# Patient Record
Sex: Male | Born: 1960
Health system: Southern US, Community
[De-identification: ages and names within clinical notes are randomized; demographics above are authoritative.]

## PROBLEM LIST (undated history)

## (undated) DIAGNOSIS — I10 Essential (primary) hypertension: Secondary | ICD-10-CM

---

## 2018-03-22 ENCOUNTER — Emergency Department (HOSPITAL_COMMUNITY): Payer: Self-pay

## 2018-03-22 ENCOUNTER — Inpatient Hospital Stay (HOSPITAL_COMMUNITY)
Admission: EM | Admit: 2018-03-22 | Discharge: 2018-03-25 | DRG: 641 | Disposition: A | Discharge status: Home / Self Care | Payer: Self-pay | Attending: Family Medicine | Admitting: Family Medicine

## 2018-03-22 ENCOUNTER — Encounter (HOSPITAL_COMMUNITY): Payer: Self-pay | Admitting: Internal Medicine

## 2018-03-22 DIAGNOSIS — D61818 Other pancytopenia: Secondary | ICD-10-CM | POA: Diagnosis present

## 2018-03-22 DIAGNOSIS — E871 Hypo-osmolality and hyponatremia: Principal | ICD-10-CM | POA: Diagnosis present

## 2018-03-22 DIAGNOSIS — F10220 Alcohol dependence with intoxication, uncomplicated: Secondary | ICD-10-CM | POA: Diagnosis present

## 2018-03-22 DIAGNOSIS — D696 Thrombocytopenia, unspecified: Secondary | ICD-10-CM | POA: Diagnosis present

## 2018-03-22 DIAGNOSIS — F10239 Alcohol dependence with withdrawal, unspecified: Secondary | ICD-10-CM | POA: Diagnosis not present

## 2018-03-22 DIAGNOSIS — K7010 Alcoholic hepatitis without ascites: Secondary | ICD-10-CM | POA: Diagnosis present

## 2018-03-22 DIAGNOSIS — IMO0001 Reserved for inherently not codable concepts without codable children: Secondary | ICD-10-CM | POA: Diagnosis present

## 2018-03-22 DIAGNOSIS — S0101XA Laceration without foreign body of scalp, initial encounter: Secondary | ICD-10-CM | POA: Diagnosis present

## 2018-03-22 DIAGNOSIS — F10920 Alcohol use, unspecified with intoxication, uncomplicated: Secondary | ICD-10-CM

## 2018-03-22 DIAGNOSIS — W109XXA Fall (on) (from) unspecified stairs and steps, initial encounter: Secondary | ICD-10-CM | POA: Diagnosis present

## 2018-03-22 DIAGNOSIS — Z87891 Personal history of nicotine dependence: Secondary | ICD-10-CM

## 2018-03-22 DIAGNOSIS — K760 Fatty (change of) liver, not elsewhere classified: Secondary | ICD-10-CM | POA: Diagnosis present

## 2018-03-22 DIAGNOSIS — F1020 Alcohol dependence, uncomplicated: Secondary | ICD-10-CM | POA: Diagnosis present

## 2018-03-22 DIAGNOSIS — Y908 Blood alcohol level of 240 mg/100 ml or more: Secondary | ICD-10-CM | POA: Diagnosis present

## 2018-03-22 DIAGNOSIS — E876 Hypokalemia: Secondary | ICD-10-CM | POA: Diagnosis present

## 2018-03-22 LAB — BPAM FFP
BLOOD PRODUCT EXPIRATION DATE: 201909112359
Blood Product Expiration Date: 201909112359
ISSUE DATE / TIME: 201909071525
ISSUE DATE / TIME: 201909071525
UNIT TYPE AND RH: 6200
UNIT TYPE AND RH: 6200

## 2018-03-22 LAB — SODIUM, URINE, RANDOM: Sodium, Ur: <10 mmol/L

## 2018-03-22 LAB — I-STAT CHEM 8, ED
BUN: 10 mg/dL (ref 6–20)
Calcium, Ion: 0.9 mmol/L — ABNORMAL LOW (ref 1.15–1.40)
Chloride: 85 mmol/L — ABNORMAL LOW (ref 98–111)
Creatinine, Ser: 1.3 mg/dL — ABNORMAL HIGH (ref 0.61–1.24)
Glucose, Bld: 125 mg/dL — ABNORMAL HIGH (ref 70–99)
HEMATOCRIT: 35 % — AB (ref 39.0–52.0)
Hemoglobin: 11.9 g/dL — ABNORMAL LOW (ref 13.0–17.0)
Potassium: 3.6 mmol/L (ref 3.5–5.1)
SODIUM: 120 mmol/L — AB (ref 135–145)
TCO2: 21 mmol/L — AB (ref 22–32)

## 2018-03-22 LAB — CHLORIDE, URINE, RANDOM

## 2018-03-22 LAB — CBC
HEMATOCRIT: 31.1 % — AB (ref 39.0–52.0)
HEMOGLOBIN: 11.2 g/dL — AB (ref 13.0–17.0)
MCH: 33.7 pg (ref 26.0–34.0)
MCHC: 36 g/dL (ref 30.0–36.0)
MCV: 93.7 fL (ref 78.0–100.0)
Platelets: 84 10*3/uL — ABNORMAL LOW (ref 150–400)
RBC: 3.32 MIL/uL — ABNORMAL LOW (ref 4.22–5.81)
RDW: 12.7 % (ref 11.5–15.5)
WBC: 3.8 10*3/uL — AB (ref 4.0–10.5)

## 2018-03-22 LAB — COMPREHENSIVE METABOLIC PANEL
ALBUMIN: 3.7 g/dL (ref 3.5–5.0)
ALT: 67 U/L — ABNORMAL HIGH (ref 0–44)
AST: 151 U/L — AB (ref 15–41)
Alkaline Phosphatase: 102 U/L (ref 38–126)
Anion gap: 15 (ref 5–15)
BUN: 9 mg/dL (ref 6–20)
CO2: 21 mmol/L — ABNORMAL LOW (ref 22–32)
Calcium: 7.9 mg/dL — ABNORMAL LOW (ref 8.9–10.3)
Chloride: 84 mmol/L — ABNORMAL LOW (ref 98–111)
Creatinine, Ser: 0.64 mg/dL (ref 0.61–1.24)
GFR calc Af Amer: >60 mL/min (ref >60)
GFR calc non Af Amer: 57 mL/min — ABNORMAL LOW (ref >60)
Glucose, Bld: 125 mg/dL — ABNORMAL HIGH (ref 70–99)
POTASSIUM: 3.5 mmol/L (ref 3.5–5.1)
SODIUM: 120 mmol/L — AB (ref 135–145)
Total Bilirubin: 0.7 mg/dL (ref 0.3–1.2)
Total Protein: 7.5 g/dL (ref 6.5–8.1)

## 2018-03-22 LAB — PREPARE FRESH FROZEN PLASMA
Unit division: 0
Unit division: 0

## 2018-03-22 LAB — URINALYSIS, ROUTINE W REFLEX MICROSCOPIC
BILIRUBIN URINE: NEGATIVE
Bacteria, UA: NONE SEEN
Glucose, UA: NEGATIVE mg/dL
Ketones, ur: NEGATIVE mg/dL
Leukocytes, UA: NEGATIVE
NITRITE: NEGATIVE
PROTEIN: NEGATIVE mg/dL
SPECIFIC GRAVITY, URINE: 1.016 (ref 1.005–1.030)
pH: 6 (ref 5.0–8.0)

## 2018-03-22 LAB — I-STAT CG4 LACTIC ACID, ED: LACTIC ACID, VENOUS: 2.43 mmol/L — AB (ref 0.5–1.9)

## 2018-03-22 LAB — PROTIME-INR
INR: 1.02
Prothrombin Time: 13.3 seconds (ref 11.4–15.2)

## 2018-03-22 LAB — OSMOLALITY, URINE: Osmolality, Ur: 249 mOsm/kg — ABNORMAL LOW (ref 300–900)

## 2018-03-22 LAB — CDS SEROLOGY

## 2018-03-22 LAB — ETHANOL: Alcohol, Ethyl (B): 434 mg/dL (ref <10)

## 2018-03-22 MED ORDER — LORAZEPAM 2 MG/ML IJ SOLN
1.0000 mg | Freq: Four times a day (QID) | INTRAMUSCULAR | Status: DC | PRN
  Administered 2018-03-23: 1 mg via INTRAVENOUS
  Filled 2018-03-22: qty 1

## 2018-03-22 MED ORDER — VITAMIN B-1 100 MG PO TABS
100.0000 mg | ORAL_TABLET | Freq: Every day | ORAL | Status: DC
  Administered 2018-03-22 – 2018-03-25 (×4): 100 mg via ORAL
  Filled 2018-03-22 (×5): qty 1

## 2018-03-22 MED ORDER — ACETAMINOPHEN 650 MG RE SUPP: 650.0000 mg | Freq: Four times a day (QID) | RECTAL | Status: DC | PRN

## 2018-03-22 MED ORDER — THIAMINE HCL 100 MG/ML IJ SOLN: 100.0000 mg | Freq: Every day | INTRAMUSCULAR | Status: DC

## 2018-03-22 MED ORDER — ADULT MULTIVITAMIN W/MINERALS CH
1.0000 | ORAL_TABLET | Freq: Every day | ORAL | Status: DC
  Administered 2018-03-22 – 2018-03-25 (×4): 1 via ORAL
  Filled 2018-03-22 (×4): qty 1

## 2018-03-22 MED ORDER — FOLIC ACID 1 MG PO TABS
1.0000 mg | ORAL_TABLET | Freq: Every day | ORAL | Status: DC
  Administered 2018-03-22 – 2018-03-25 (×4): 1 mg via ORAL
  Filled 2018-03-22 (×4): qty 1

## 2018-03-22 MED ORDER — VITAMIN B-1 100 MG PO TABS: 100.0000 mg | ORAL_TABLET | Freq: Every day | ORAL | Status: DC

## 2018-03-22 MED ORDER — ENOXAPARIN SODIUM 40 MG/0.4ML ~~LOC~~ SOLN
40.0000 mg | SUBCUTANEOUS | Status: DC
  Administered 2018-03-23 – 2018-03-25 (×3): 40 mg via SUBCUTANEOUS
  Filled 2018-03-22 (×3): qty 0.4

## 2018-03-22 MED ORDER — IOPAMIDOL (ISOVUE-300) INJECTION 61%
100.0000 mL | Freq: Once | INTRAVENOUS | Status: AC | PRN
  Administered 2018-03-22: 100 mL via INTRAVENOUS

## 2018-03-22 MED ORDER — SODIUM CHLORIDE 0.9 % IV SOLN
INTRAVENOUS | Status: DC
  Administered 2018-03-22: 19:00:00 via INTRAVENOUS

## 2018-03-22 MED ORDER — FOLIC ACID 1 MG PO TABS: 1.0000 mg | ORAL_TABLET | Freq: Every day | ORAL | Status: DC

## 2018-03-22 MED ORDER — LORAZEPAM 1 MG PO TABS
1.0000 mg | ORAL_TABLET | Freq: Four times a day (QID) | ORAL | Status: DC | PRN
  Administered 2018-03-22 – 2018-03-23 (×2): 1 mg via ORAL
  Filled 2018-03-22 (×2): qty 1

## 2018-03-22 MED ORDER — ONDANSETRON HCL 4 MG PO TABS: 4.0000 mg | ORAL_TABLET | Freq: Four times a day (QID) | ORAL | Status: DC | PRN

## 2018-03-22 MED ORDER — ONDANSETRON HCL 4 MG/2ML IJ SOLN
4.0000 mg | Freq: Four times a day (QID) | INTRAMUSCULAR | Status: DC | PRN
  Administered 2018-03-23: 4 mg via INTRAVENOUS
  Filled 2018-03-22: qty 2

## 2018-03-22 MED ORDER — POTASSIUM CHLORIDE CRYS ER 20 MEQ PO TBCR
40.0000 meq | EXTENDED_RELEASE_TABLET | Freq: Once | ORAL | Status: AC
  Administered 2018-03-22: 40 meq via ORAL
  Filled 2018-03-22: qty 2

## 2018-03-22 MED ORDER — ACETAMINOPHEN 325 MG PO TABS: 650.0000 mg | ORAL_TABLET | Freq: Four times a day (QID) | ORAL | Status: DC | PRN

## 2018-03-22 NOTE — ED Provider Notes (Signed)
[facility] EMERGENCY DEPARTMENT Provider Note   CSN: [id number] Arrival date & time: 03/22/18  1529     History   Chief Complaint No chief complaint on file.   HPI Stephen Chung is a 57 y.o. male.  HPI  The patient arrives by ambulance after he fell down the stairs, it is unclear how many stairs he fell down but was extremely intoxicated, had a large head laceration, was transported by paramedics initially hypotensive and altered.  At this time the patient states he only has a headache, denies any other complaints including chest pain shortness of breath arm or leg pain.  He had obvious trauma to his right upper chest with a mild bruise and a laceration of the back of his head which was treated with a dressing prehospital and cervical collar immobilization.  The patient is unable to give much more information as he is extremely intoxicated.  Level 5 caveat applies.  No past medical history on file.  There are no active problems to display for this patient.    The histories are not reviewed yet. Please review them in the "History" navigator section and refresh this SmartLink.    Denies PMH - denies medical problems, Denies medications Denies allergies to medicines Denies tob use Endorses ETO"H use    Home Medications    Prior to Admission medications   Not on File    Family History No family history on file.  Social History Social History   Tobacco Use  . Smoking status: Not on file  Substance Use Topics  . Alcohol use: Not on file  . Drug use: Not on file     Allergies   Patient has no allergy information on record.   Review of Systems Review of Systems  Unable to perform ROS: Mental status change     Physical Exam Updated Vital Signs There were no vitals taken for this visit.  Physical Exam  Constitutional: He appears well-developed and well-nourished. No distress.  HENT:  Head: Normocephalic.  Mouth/Throat: Oropharynx  is clear and moist. No oropharyngeal exudate.  Laceration to the posterior occiput, no other signs of injury  Eyes: Pupils are equal, round, and reactive to light. Conjunctivae and EOM are normal. Right eye exhibits no discharge. Left eye exhibits no discharge. No scleral icterus.  Neck: No JVD present. No thyromegaly present.  Immobilized in a cervical collar, denies tenderness on the posterior spine  Cardiovascular: Normal rate, regular rhythm, normal heart sounds and intact distal pulses. Exam reveals no gallop and no friction rub.  No murmur heard. Pulmonary/Chest: Effort normal and breath sounds normal. No respiratory distress. He has no wheezes. He has no rales. He exhibits tenderness ( Mild tenderness to the right upper chest wall, small bruise there, no crepitance or subcutaneous emphysema).  Abdominal: Soft. Bowel sounds are normal. He exhibits no distension and no mass. There is no tenderness.  Musculoskeletal: Normal range of motion. He exhibits no edema or tenderness.  All 4 extremities are supple, joints are supple, compartments are soft diffusely, moving everything without any deformity or difficulty  Lymphadenopathy:    He has no cervical adenopathy.  Neurological: He is alert. Coordination normal.  Follows commands, able to straight leg raise bilaterally with normal strength, grips normal, coordination normal, cranial nerves III through XII appear normal  Skin: Skin is warm and dry. No rash noted. No erythema.  Laceration to scalp, bruise to the right upper chest  Psychiatric: He has a normal  mood and affect. His behavior is normal.  Nursing note and vitals reviewed.    ED Treatments / Results  Labs (all labs ordered are listed, but only abnormal results are displayed) Labs Reviewed  CDS SEROLOGY  COMPREHENSIVE METABOLIC PANEL  CBC  ETHANOL  URINALYSIS, ROUTINE W REFLEX MICROSCOPIC  PROTIME-INR  I-STAT CHEM 8, ED  I-STAT CG4 LACTIC ACID, ED  PREPARE FRESH FROZEN  PLASMA  TYPE AND SCREEN  SAMPLE TO BLOOD BANK    EKG None  Radiology No results found.  Procedures .Marland KitchenLaceration Repair Date/Time: 03/22/2018 5:29 PM Performed by: Eber Hong, MD Authorized by: Eber Hong, MD   Consent:    Consent obtained:  Verbal   Consent given by:  Patient   Risks discussed:  Infection, pain, need for additional repair, poor cosmetic result and poor wound healing   Alternatives discussed:  No treatment and delayed treatment Anesthesia (see MAR for exact dosages):    Anesthesia method:  None Laceration details:    Location:  Scalp   Scalp location:  Occipital   Length (cm):  7   Depth (mm):  3 Repair type:    Repair type:  Simple Pre-procedure details:    Preparation:  Patient was prepped and draped in usual sterile fashion and imaging obtained to evaluate for foreign bodies Exploration:    Hemostasis achieved with:  Direct pressure   Wound exploration: wound explored through full range of motion and entire depth of wound probed and visualized     Wound extent: no fascia violation noted, no foreign bodies/material noted, no muscle damage noted, no nerve damage noted, no tendon damage noted, no underlying fracture noted and no vascular damage noted   Treatment:    Area cleansed with:  Betadine   Amount of cleaning:  Standard   Irrigation solution:  Sterile saline   Irrigation method:  Syringe Skin repair:    Repair method:  Sutures and staples   Suture technique:  Simple interrupted   Number of staples:  5 Approximation:    Approximation:  Close Post-procedure details:    Dressing:  Antibiotic ointment and sterile dressing   Patient tolerance of procedure:  Tolerated well, no immediate complications Comments:        .Critical Care Performed by: Eber Hong, MD Authorized by: Eber Hong, MD   Critical care provider statement:    Critical care time (minutes):  35   Critical care time was exclusive of:  Separately billable  procedures and treating other patients and teaching time   Critical care was necessary to treat or prevent imminent or life-threatening deterioration of the following conditions:  Trauma   Critical care was time spent personally by me on the following activities:  Blood draw for specimens, development of treatment plan with patient or surrogate, discussions with consultants, evaluation of patient's response to treatment, examination of patient, obtaining history from patient or surrogate, ordering and performing treatments and interventions, ordering and review of laboratory studies, ordering and review of radiographic studies, pulse oximetry, re-evaluation of patient's condition and review of old charts   (including critical care time)  Medications Ordered in ED Medications - No data to display   Initial Impression / Assessment and Plan / ED Course  I have reviewed the triage vital signs and the nursing notes.  Pertinent labs & imaging results that were available during my care of the patient were reviewed by me and considered in my medical decision making (see chart for details).  Clinical Course as of  Mar 22 1728  Sat Mar 22, 2018  1707 CT scans reviewed, large hematoma to the scalp otherwise no intracranial injuries, the patient's sodium is very low at 120, he will likely need to be admitted to the hospital because of the significant electrolyte abnormality which may have contributed to his fall however the alcohol level of 430 likely also contributed.  No other injuries on the CT scan of the chest abdomen and pelvis   [BM]    Clinical Course User Index [BM] Eber Hong, MD   The patient is suffered a head injury, will need CT scan of the head and cervical spine as well as the chest, it is unclear exactly what happened as the patient is unable to give me a very thorough history.  There is a significant leg which barrier.  Level 5 caveat applies, CT scans pending.  The patient will remain  immobilized in a cervical collar.  T scans revealed no signs of intracranial injury, there is a large hematoma, the laceration was repaired, I discussed the case with Dr. Doylene Canard who deferred to the medical service for admission for the hyponatremia.  I discussed the patient's care with the hospitalist to admit to the hospital.  The patient did require multiple repeat evaluations with close respiratory and neurologic monitoring secondary to severe intoxication, altered mental status and initially hypotension which gradually improved.  He has been given saline at a rate of 250 cc an hour to begin the resuscitation.  Final Clinical Impressions(s) / ED Diagnoses   Final diagnoses:  Laceration of occipital region of scalp, initial encounter  Alcoholic intoxication without complication (HCC)  Hyponatremia     Eber Hong, MD 03/22/18 1851

## 2018-03-22 NOTE — Progress Notes (Signed)
Chaplain responded to Trauma call. Chaplain connected with healthcare team. Team communicated no Pt. Needs at this time.
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
## 2018-03-22 NOTE — ED Triage Notes (Signed)
Patient arrived by Stephen Chung after witnessed falling down 15 steps this afternoon. Questionable loc. Family reported heavy ETOH today with large head laceration. Bleeding controlled with dressing intact -patient speaks spanish and complains of head pain, MAE X 4

## 2018-03-23 ENCOUNTER — Other Ambulatory Visit: Payer: Self-pay

## 2018-03-23 ENCOUNTER — Encounter (HOSPITAL_COMMUNITY): Payer: Self-pay | Admitting: General Practice

## 2018-03-23 LAB — BASIC METABOLIC PANEL
ANION GAP: 12 (ref 5–15)
ANION GAP: 11 (ref 5–15)
ANION GAP: 15 (ref 5–15)
Anion gap: 12 (ref 5–15)
BUN: 11 mg/dL (ref 6–20)
BUN: 6 mg/dL (ref 6–20)
BUN: 6 mg/dL (ref 6–20)
BUN: 7 mg/dL (ref 6–20)
CALCIUM: 9.1 mg/dL (ref 8.9–10.3)
CALCIUM: 8 mg/dL — AB (ref 8.9–10.3)
CALCIUM: 8.6 mg/dL — AB (ref 8.9–10.3)
CHLORIDE: 95 mmol/L — AB (ref 98–111)
CO2: 23 mmol/L (ref 22–32)
CO2: 23 mmol/L (ref 22–32)
CO2: 23 mmol/L (ref 22–32)
CO2: 19 mmol/L — AB (ref 22–32)
CREATININE: 0.49 mg/dL — AB (ref 0.61–1.24)
CREATININE: 0.54 mg/dL — AB (ref 0.61–1.24)
Calcium: 8.7 mg/dL — ABNORMAL LOW (ref 8.9–10.3)
Chloride: 92 mmol/L — ABNORMAL LOW (ref 98–111)
Chloride: 96 mmol/L — ABNORMAL LOW (ref 98–111)
Chloride: 98 mmol/L (ref 98–111)
Creatinine, Ser: 0.57 mg/dL — ABNORMAL LOW (ref 0.61–1.24)
Creatinine, Ser: 0.65 mg/dL (ref 0.61–1.24)
GFR calc Af Amer: >60 mL/min (ref >60)
GFR calc Af Amer: >60 mL/min (ref >60)
GFR calc Af Amer: >60 mL/min (ref >60)
GFR calc non Af Amer: >60 mL/min (ref >60)
GFR calc non Af Amer: >60 mL/min (ref >60)
GLUCOSE: 170 mg/dL — AB (ref 70–99)
GLUCOSE: 92 mg/dL (ref 70–99)
Glucose, Bld: 85 mg/dL (ref 70–99)
Glucose, Bld: 202 mg/dL — ABNORMAL HIGH (ref 70–99)
POTASSIUM: 4.2 mmol/L (ref 3.5–5.1)
POTASSIUM: 4.2 mmol/L (ref 3.5–5.1)
Potassium: 4 mmol/L (ref 3.5–5.1)
Potassium: 4.8 mmol/L (ref 3.5–5.1)
Sodium: 126 mmol/L — ABNORMAL LOW (ref 135–145)
Sodium: 130 mmol/L — ABNORMAL LOW (ref 135–145)
Sodium: 130 mmol/L — ABNORMAL LOW (ref 135–145)
Sodium: 133 mmol/L — ABNORMAL LOW (ref 135–145)

## 2018-03-23 LAB — TYPE AND SCREEN
ABO/RH(D): O POS
ANTIBODY SCREEN: NEGATIVE
UNIT DIVISION: 0
Unit division: 0

## 2018-03-23 LAB — CBC
HCT: 33.2 % — ABNORMAL LOW (ref 39.0–52.0)
Hemoglobin: 11.7 g/dL — ABNORMAL LOW (ref 13.0–17.0)
MCH: 33.7 pg (ref 26.0–34.0)
MCHC: 35.2 g/dL (ref 30.0–36.0)
MCV: 95.7 fL (ref 78.0–100.0)
PLATELETS: 84 10*3/uL — AB (ref 150–400)
RBC: 3.47 MIL/uL — AB (ref 4.22–5.81)
RDW: 13.2 % (ref 11.5–15.5)
WBC: 6.1 10*3/uL (ref 4.0–10.5)

## 2018-03-23 LAB — MAGNESIUM: MAGNESIUM: 2.1 mg/dL (ref 1.7–2.4)

## 2018-03-23 LAB — IRON AND TIBC
IRON: 120 ug/dL (ref 45–182)
Saturation Ratios: 29 % (ref 17.9–39.5)
TIBC: 414 ug/dL (ref 250–450)
UIBC: 294 ug/dL

## 2018-03-23 LAB — HIV ANTIBODY (ROUTINE TESTING W REFLEX): HIV Screen 4th Generation wRfx: NONREACTIVE

## 2018-03-23 LAB — BPAM RBC
Blood Product Expiration Date: 201909302359
Blood Product Expiration Date: 201910072359
ISSUE DATE / TIME: 201909071524
ISSUE DATE / TIME: 201909071524
Unit Type and Rh: 9500
Unit Type and Rh: 9500

## 2018-03-23 LAB — OSMOLALITY: Osmolality: 372 mOsm/kg (ref 275–295)

## 2018-03-23 LAB — ABO/RH: ABO/RH(D): O POS

## 2018-03-23 LAB — FERRITIN: Ferritin: 63 ng/mL (ref 24–336)

## 2018-03-23 MED ORDER — DEXTROSE 5 % IV BOLUS
500.0000 mL | Freq: Once | INTRAVENOUS | Status: AC
  Administered 2018-03-23: 500 mL via INTRAVENOUS

## 2018-03-23 MED ORDER — CHLORDIAZEPOXIDE HCL 5 MG PO CAPS
10.0000 mg | ORAL_CAPSULE | Freq: Three times a day (TID) | ORAL | Status: DC
  Administered 2018-03-23 – 2018-03-24 (×4): 10 mg via ORAL
  Filled 2018-03-23 (×4): qty 2

## 2018-03-23 MED ORDER — LORAZEPAM 1 MG PO TABS
1.0000 mg | ORAL_TABLET | Freq: Four times a day (QID) | ORAL | Status: DC | PRN
  Administered 2018-03-23: 1 mg via ORAL

## 2018-03-23 MED ORDER — LORAZEPAM 1 MG PO TABS
0.0000 mg | ORAL_TABLET | Freq: Four times a day (QID) | ORAL | Status: DC
  Administered 2018-03-23: 1 mg via ORAL
  Filled 2018-03-23 (×2): qty 1

## 2018-03-23 MED ORDER — SODIUM CHLORIDE 0.45 % IV SOLN
INTRAVENOUS | Status: DC
  Administered 2018-03-24: 01:00:00 via INTRAVENOUS

## 2018-03-23 MED ORDER — LORAZEPAM 2 MG/ML IJ SOLN: 1.0000 mg | Freq: Four times a day (QID) | INTRAMUSCULAR | Status: DC | PRN

## 2018-03-23 MED ORDER — LORAZEPAM 1 MG PO TABS: 0.0000 mg | ORAL_TABLET | Freq: Two times a day (BID) | ORAL | Status: DC

## 2018-03-23 MED ORDER — DEXTROSE 5 % IV SOLN
INTRAVENOUS | Status: DC
  Administered 2018-03-23 (×2): via INTRAVENOUS
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
## 2018-03-24 ENCOUNTER — Encounter (HOSPITAL_COMMUNITY): Payer: Self-pay

## 2018-03-24 LAB — CBC
HEMATOCRIT: 33.6 % — AB (ref 39.0–52.0)
HEMOGLOBIN: 11.7 g/dL — AB (ref 13.0–17.0)
MCH: 33.4 pg (ref 26.0–34.0)
MCHC: 34.8 g/dL (ref 30.0–36.0)
MCV: 96 fL (ref 78.0–100.0)
Platelets: 82 10*3/uL — ABNORMAL LOW (ref 150–400)
RBC: 3.5 MIL/uL — ABNORMAL LOW (ref 4.22–5.81)
RDW: 13 % (ref 11.5–15.5)
WBC: 5.1 10*3/uL (ref 4.0–10.5)

## 2018-03-24 LAB — BASIC METABOLIC PANEL
Anion gap: 10 (ref 5–15)
Anion gap: 11 (ref 5–15)
BUN: 9 mg/dL (ref 6–20)
BUN: 12 mg/dL (ref 6–20)
CHLORIDE: 96 mmol/L — AB (ref 98–111)
CHLORIDE: 97 mmol/L — AB (ref 98–111)
CO2: 25 mmol/L (ref 22–32)
CO2: 23 mmol/L (ref 22–32)
Calcium: 9.2 mg/dL (ref 8.9–10.3)
Calcium: 9 mg/dL (ref 8.9–10.3)
Creatinine, Ser: 0.57 mg/dL — ABNORMAL LOW (ref 0.61–1.24)
Creatinine, Ser: 0.78 mg/dL (ref 0.61–1.24)
GFR calc non Af Amer: >60 mL/min (ref >60)
GFR calc non Af Amer: >60 mL/min (ref >60)
Glucose, Bld: 103 mg/dL — ABNORMAL HIGH (ref 70–99)
Glucose, Bld: 126 mg/dL — ABNORMAL HIGH (ref 70–99)
POTASSIUM: 3.9 mmol/L (ref 3.5–5.1)
POTASSIUM: 3.9 mmol/L (ref 3.5–5.1)
Sodium: 131 mmol/L — ABNORMAL LOW (ref 135–145)
Sodium: 131 mmol/L — ABNORMAL LOW (ref 135–145)

## 2018-03-24 LAB — MAGNESIUM: Magnesium: 1.9 mg/dL (ref 1.7–2.4)

## 2018-03-24 MED ORDER — HYDRALAZINE HCL 20 MG/ML IJ SOLN
10.0000 mg | INTRAMUSCULAR | Status: DC | PRN
  Administered 2018-03-24: 10 mg via INTRAVENOUS
  Administered 2018-03-25: 0.5 mg via INTRAVENOUS
  Filled 2018-03-24 (×2): qty 1

## 2018-03-24 MED ORDER — CHLORDIAZEPOXIDE HCL 5 MG PO CAPS
5.0000 mg | ORAL_CAPSULE | Freq: Every day | ORAL | Status: DC
  Administered 2018-03-25: 5 mg via ORAL
  Filled 2018-03-24: qty 1

## 2018-03-24 MED ORDER — SODIUM CHLORIDE 0.9 % IV SOLN
INTRAVENOUS | Status: DC
  Administered 2018-03-24: 13:00:00 via INTRAVENOUS

## 2018-03-24 MED ORDER — HYDRALAZINE HCL 20 MG/ML IJ SOLN
10.0000 mg | Freq: Once | INTRAMUSCULAR | Status: AC
  Administered 2018-03-24: 10 mg via INTRAVENOUS
  Filled 2018-03-24: qty 1

## 2018-03-24 NOTE — Progress Notes (Signed)
Patients B/P is 194/88 and HR is 73. MD notified. Orders placed. Will continue to monitor.
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
## 2018-03-24 NOTE — Progress Notes (Signed)
[place] texted with bp of 191/105. No bp meds on profile. Otherwise,no complaints. CIWA of 4.

## 2018-03-25 DIAGNOSIS — K760 Fatty (change of) liver, not elsewhere classified: Secondary | ICD-10-CM

## 2018-03-25 DIAGNOSIS — E871 Hypo-osmolality and hyponatremia: Principal | ICD-10-CM

## 2018-03-25 DIAGNOSIS — D696 Thrombocytopenia, unspecified: Secondary | ICD-10-CM

## 2018-03-25 DIAGNOSIS — F1020 Alcohol dependence, uncomplicated: Secondary | ICD-10-CM

## 2018-03-25 LAB — BLOOD PRODUCT ORDER (VERBAL) VERIFICATION

## 2018-03-25 LAB — BASIC METABOLIC PANEL
Anion gap: 11 (ref 5–15)
BUN: 8 mg/dL (ref 6–20)
CHLORIDE: 99 mmol/L (ref 98–111)
CO2: 24 mmol/L (ref 22–32)
Calcium: 9.2 mg/dL (ref 8.9–10.3)
Creatinine, Ser: 0.56 mg/dL — ABNORMAL LOW (ref 0.61–1.24)
GFR calc Af Amer: >60 mL/min (ref >60)
GFR calc non Af Amer: >60 mL/min (ref >60)
GLUCOSE: 124 mg/dL — AB (ref 70–99)
POTASSIUM: 3.6 mmol/L (ref 3.5–5.1)
Sodium: 134 mmol/L — ABNORMAL LOW (ref 135–145)

## 2018-03-25 LAB — CBC
HEMATOCRIT: 33.5 % — AB (ref 39.0–52.0)
HEMOGLOBIN: 11.5 g/dL — AB (ref 13.0–17.0)
MCH: 33.8 pg (ref 26.0–34.0)
MCHC: 34.3 g/dL (ref 30.0–36.0)
MCV: 98.5 fL (ref 78.0–100.0)
Platelets: 108 10*3/uL — ABNORMAL LOW (ref 150–400)
RBC: 3.4 MIL/uL — ABNORMAL LOW (ref 4.22–5.81)
RDW: 13.4 % (ref 11.5–15.5)
WBC: 4.7 10*3/uL (ref 4.0–10.5)

## 2018-03-25 LAB — MAGNESIUM: Magnesium: 1.8 mg/dL (ref 1.7–2.4)

## 2018-03-25 NOTE — Social Work (Cosign Needed Addendum)
Nurse, CSW, and social work intern were present to assess the needs of Stephen Chung. [employer] video interpreting service was utilized and Alecia Lemming [phone]) was the live interpreter. CSW received referral for current substance abuse and providing resources in [place]. When asked about his alcohol use and if he deemed it a problem, Stephen Chung responded that his drinking had become a problem since his son died a month ago. He communicated that drinking had become his way of coping with the stress and sadness caused by the loss of his son. The patient reported that there are a couple of religious brothers from church that understand his situation and check on him. Stephen Chung communicated that he is going to work on his drinking problem with the help of these brothers, focusing on staying close to God, and working out to stay healthy. Nurse, CSW, and SW intern expressed understanding and empathetic responses.  The nurse, CSW, and SW intern then addressed how he will get home after discharging from the hospital. Stephen Chung did not know his address and did not know the directions to his home. He had never used the [place] bus system before. He did not have a ride from a friend and the only family that he has is a son in [place]. The patient was able to identify a local [place] grocery store near his home called "[employer]". CSW will be providing patient with a cab voucher to [employer] at [address], [place]. He is able to walk to his home from this location as it is on the same side of the road and not far away. CSW signing off as no other SW intervention services are needed.   Sydney Littrell Social Work Intern   03/25/18 (4:02 pm) - CSW reviewed CSW intern's note and discussed changes needed. Note has been reviewed again and is approved as written.  Genelle Bal, MSW, LCSW Licensed Clinical Social Worker Clinical Social Work Department [employer] [phone]
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
## 2018-03-25 NOTE — Progress Notes (Signed)
Stephen Chung to be D/C'd Home per MD order.  Discussed prescriptions and follow up appointments with the patient. Prescriptions given to patient, medication list explained in detail. Pt verbalized understanding.  Allergies as of 03/25/2018   No Known Allergies     Medication List    You have not been prescribed any medications.     Vitals:   03/25/18 0406 03/25/18 0841  BP: (!) 188/98 (!) 158/81  Pulse: 72 79  Resp: 16 18  Temp: 98.2 F (36.8 C) (!) 97.4 F (36.3 C)  SpO2: 96% 97%    Skin clean, dry and intact without evidence of skin break down, no evidence of skin tears noted. IV catheter discontinued intact. Site without signs and symptoms of complications. Dressing and pressure applied. Pt denies pain at this time. No complaints noted.  An After Visit Summary was printed and given to the patient. Patient escorted via WC, and D/C home via cab.  Selina Cooley BSN, RN

## 2018-10-19 ENCOUNTER — Other Ambulatory Visit: Payer: Self-pay

## 2018-10-19 ENCOUNTER — Encounter (HOSPITAL_COMMUNITY): Payer: Self-pay | Admitting: Emergency Medicine

## 2018-10-19 ENCOUNTER — Ambulatory Visit (HOSPITAL_COMMUNITY)
Admission: EM | Admit: 2018-10-19 | Discharge: 2018-10-19 | Disposition: A | Discharge status: Home / Self Care | Payer: Self-pay

## 2018-10-19 DIAGNOSIS — M79602 Pain in left arm: Secondary | ICD-10-CM

## 2018-10-19 HISTORY — DX: Essential (primary) hypertension: I10

## 2018-10-19 NOTE — ED Triage Notes (Signed)
Pt here for htn and sts hasnt taken meds in some time

## 2018-10-19 NOTE — ED Provider Notes (Signed)
[facility]    CSN: [id number] Arrival date & time: 10/19/18  1128     History   Chief Complaint Chief Complaint  Patient presents with  . Hypertension    HPI Stephen Chung is a 58 y.o. male.   This is a 58 year old man who presents with mild left biceps pain.  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  Patient is having no cough or fever.  He has had no recent trauma to the arm.  He is having no chest pain, shortness of breath, or headache.  No other joints are bothering him.  Patient says that he has a history of hypertension many years ago but he has not taken medication for this in 3 years.     Past Medical History:  Diagnosis Date  . Hypertension     Patient Active Problem List   Diagnosis Date Noted  . Hyponatremia 03/22/2018  . Alcoholism /alcohol abuse (HCC) 03/22/2018  . Thrombocytopenia (HCC) 03/22/2018  . Hepatic steatosis 03/22/2018    History reviewed. No pertinent surgical history.     Home Medications    Prior to Admission medications   Not on File    Family History History reviewed. No pertinent family history.  Social History Social History   Tobacco Use  . Smoking status: Former Smoker  Substance Use Topics  . Alcohol use: Yes    Alcohol/week: 1.0 standard drinks    Types: 1 Cans of beer per week    Comment: suspect heavier alcohol use  . Drug use: Never     Allergies   Patient has no known allergies.   Review of Systems Review of Systems  Musculoskeletal: Positive for myalgias.  All other systems reviewed and are negative.    Physical Exam Triage Vital Signs ED Triage Vitals  Enc Vitals Group     BP      Pulse      Resp      Temp      Temp src      SpO2      Weight      Height      Head Circumference      Peak Flow      Pain Score      Pain Loc      Pain Edu?      Excl. in GC?    No data found.  Updated Vital Signs BP 128/79  (BP Location: Right Arm)   Pulse 71   Temp 98.3 F (36.8 C) (Oral)   Resp 18   SpO2 96%    Physical Exam Vitals signs and nursing note reviewed.  Constitutional:      Appearance: Normal appearance. He is obese.  HENT:     Head: Normocephalic.     Right Ear: External ear normal.     Left Ear: External ear normal.     Nose: Nose normal.     Mouth/Throat:     Mouth: Mucous membranes are dry.  Eyes:     Conjunctiva/sclera: Conjunctivae normal.  Neck:     Musculoskeletal: Normal range of motion and neck supple.  Cardiovascular:     Rate and Rhythm: Normal rate and regular rhythm.     Heart sounds: Normal heart sounds.  Pulmonary:     Effort: Pulmonary effort is normal.     Breath sounds: Normal breath sounds.  Abdominal:     General: Bowel sounds are  normal.     Palpations: There is no mass.     Tenderness: There is no abdominal tenderness. There is no guarding.  Musculoskeletal: Normal range of motion.  Skin:    General: Skin is warm.  Neurological:     General: No focal deficit present.     Mental Status: He is alert.  Psychiatric:        Mood and Affect: Mood normal.      UC Treatments / Results  Labs (all labs ordered are listed, but only abnormal results are displayed) Labs Reviewed - No data to display  EKG None  Radiology No results found.  Procedures Procedures (including critical care time)  Medications Ordered in UC Medications - No data to display  Initial Impression / Assessment and Plan / UC Course  I have reviewed the triage vital signs and the nursing notes.  Pertinent labs & imaging results that were available during my care of the patient were reviewed by me and considered in my medical decision making (see chart for details).    Final Clinical Impressions(s) / UC Diagnoses   Final diagnoses:  Left arm pain   Discharge Instructions   None    ED Prescriptions    None     Controlled Substance Prescriptions [place] Controlled  Substance Registry consulted? Not Applicable   Elvina Sidle, MD 10/19/18 1249

## 2018-10-25 ENCOUNTER — Ambulatory Visit (HOSPITAL_COMMUNITY)
Admission: EM | Admit: 2018-10-25 | Discharge: 2018-10-25 | Disposition: A | Discharge status: Home / Self Care | Payer: Self-pay | Attending: Physician Assistant | Admitting: Physician Assistant

## 2018-10-25 ENCOUNTER — Other Ambulatory Visit: Payer: Self-pay

## 2018-10-25 DIAGNOSIS — M546 Pain in thoracic spine: Secondary | ICD-10-CM

## 2018-10-25 MED ORDER — MELOXICAM 7.5 MG PO TABS: 7.5000 mg | ORAL_TABLET | Freq: Every day | ORAL | 0 refills | Status: AC

## 2018-10-25 NOTE — ED Provider Notes (Signed)
[facility]    CSN: [id number] Arrival date & time: 10/25/18  1506     History   Chief Complaint Chief Complaint  Patient presents with  . Back Pain  . Headache    HPI Stephen Chung is a 58 y.o. male.   58 year old male comes in for back pain. HPI obtained by patient through video translator. Patient states had subjective fever last night, which resolved when waking up this morning. He noticed left sided thoracic back pain since that is intermittent, worse with movement. Denies injury/trauma. Denies cough, congestion, sore throat, rhinorrhea. Denies shortness of breath, wheezing. He also noticed mild headache that has resolved. He denies rashes, spreading erythema, warmth. Denies radiation of pain. Denies numbness/tingling. Has not tried anything for the symptoms.      Past Medical History:  Diagnosis Date  . Hypertension     Patient Active Problem List   Diagnosis Date Noted  . Hyponatremia 03/22/2018  . Alcoholism /alcohol abuse (HCC) 03/22/2018  . Thrombocytopenia (HCC) 03/22/2018  . Hepatic steatosis 03/22/2018    No past surgical history on file.     Home Medications    Prior to Admission medications   Medication Sig Start Date End Date Taking? Authorizing Provider  meloxicam (MOBIC) 7.5 MG tablet Take 1 tablet (7.5 mg total) by mouth daily. 10/25/18   Belinda FisherYu, Georgianne Gritz V, PA-C    Family History No family history on file.  Social History Social History   Tobacco Use  . Smoking status: Former Smoker  Substance Use Topics  . Alcohol use: Yes    Alcohol/week: 1.0 standard drinks    Types: 1 Cans of beer per week    Comment: suspect heavier alcohol use  . Drug use: Never     Allergies   Patient has no known allergies.   Review of Systems Review of Systems  Reason unable to perform ROS: See HPI as above.     Physical Exam Triage Vital Signs ED Triage Vitals  Enc Vitals Group     BP 10/25/18 1532 (!) 171/97     Pulse Rate  10/25/18 1532 91     Resp 10/25/18 1532 16     Temp 10/25/18 1532 98 F (36.7 C)     Temp Source 10/25/18 1532 Oral     SpO2 10/25/18 1532 97 %     Weight --      Height --      Head Circumference --      Peak Flow --      Pain Score 10/25/18 1533 6     Pain Loc --      Pain Edu? --      Excl. in GC? --    No data found.  Updated Vital Signs BP (!) 171/97 (BP Location: Right Arm)   Pulse 91   Temp 98 F (36.7 C) (Oral)   Resp 16   SpO2 97%   Physical Exam Constitutional:      General: He is not in acute distress.    Appearance: He is well-developed. He is not ill-appearing, toxic-appearing or diaphoretic.  HENT:     Head: Normocephalic and atraumatic.  Eyes:     Conjunctiva/sclera: Conjunctivae normal.     Pupils: Pupils are equal, round, and reactive to light.  Cardiovascular:     Rate and Rhythm: Normal rate and regular rhythm.     Heart sounds: Normal heart sounds. No murmur. No friction rub. No gallop.   Pulmonary:  Effort: Pulmonary effort is normal. No accessory muscle usage or respiratory distress.     Breath sounds: Normal breath sounds. No stridor. No decreased breath sounds, wheezing, rhonchi or rales.  Musculoskeletal:     Comments: No tenderness on palpation of the spinous processes. Tenderness to palpation of left thoracic back, with most tenderness to the paraspinal area. Full ROM of back, neck, shoulder. Strength normal and equal bilaterally. Sensation intact and equal bilaterally. Radial pulse 2+.  Skin:    General: Skin is warm and dry.  Neurological:     Mental Status: He is alert and oriented to person, place, and time.      UC Treatments / Results  Labs (all labs ordered are listed, but only abnormal results are displayed) Labs Reviewed - No data to display  EKG None  Radiology No results found.  Procedures Procedures (including critical care time)  Medications Ordered in UC Medications - No data to display  Initial Impression  / Assessment and Plan / UC Course  I have reviewed the triage vital signs and the nursing notes.  Pertinent labs & imaging results that were available during my care of the patient were reviewed by me and considered in my medical decision making (see chart for details).    Start mobic for pain. Warm compresses as directed. Continue to monitor symptoms. Return precautions given.   Final Clinical Impressions(s) / UC Diagnoses   Final diagnoses:  Acute left-sided thoracic back pain    ED Prescriptions    Medication Sig Dispense Auth. Provider   meloxicam (MOBIC) 7.5 MG tablet Take 1 tablet (7.5 mg total) by mouth daily. 15 tablet Threasa Alpha, [place] 10/25/18 1622

## 2018-10-25 NOTE — Discharge Instructions (Signed)
Start Mobic. Do not take ibuprofen (motrin/advil)/ naproxen (aleve) while on mobic. Warm compress to the back.

## 2018-10-25 NOTE — ED Triage Notes (Signed)
Per pt he has been having back pain since last night with a slight headache today. Pt has no fever, no sob no chest pain.

## 2020-04-11 ENCOUNTER — Encounter (HOSPITAL_COMMUNITY): Payer: Self-pay | Admitting: Emergency Medicine

## 2020-04-11 ENCOUNTER — Emergency Department (HOSPITAL_COMMUNITY)
Admission: EM | Admit: 2020-04-11 | Discharge: 2020-04-11 | Disposition: A | Discharge status: Home / Self Care | Payer: Self-pay | Attending: Emergency Medicine | Admitting: Emergency Medicine

## 2020-04-11 DIAGNOSIS — I10 Essential (primary) hypertension: Secondary | ICD-10-CM | POA: Insufficient documentation

## 2020-04-11 DIAGNOSIS — R040 Epistaxis: Secondary | ICD-10-CM | POA: Insufficient documentation

## 2020-04-11 DIAGNOSIS — Z5321 Procedure and treatment not carried out due to patient leaving prior to being seen by health care provider: Secondary | ICD-10-CM | POA: Insufficient documentation

## 2020-04-11 LAB — COMPREHENSIVE METABOLIC PANEL
ALT: 90 U/L — ABNORMAL HIGH (ref 0–44)
AST: 180 U/L — ABNORMAL HIGH (ref 15–41)
Albumin: 3.9 g/dL (ref 3.5–5.0)
Alkaline Phosphatase: 97 U/L (ref 38–126)
Anion gap: 10 (ref 5–15)
BUN: <5 mg/dL — ABNORMAL LOW (ref 6–20)
CO2: 25 mmol/L (ref 22–32)
Calcium: 8.6 mg/dL — ABNORMAL LOW (ref 8.9–10.3)
Chloride: 97 mmol/L — ABNORMAL LOW (ref 98–111)
Creatinine, Ser: 0.54 mg/dL — ABNORMAL LOW (ref 0.61–1.24)
GFR calc Af Amer: >60 mL/min (ref >60)
GFR calc non Af Amer: >60 mL/min (ref >60)
Glucose, Bld: 111 mg/dL — ABNORMAL HIGH (ref 70–99)
Potassium: 3.9 mmol/L (ref 3.5–5.1)
Sodium: 132 mmol/L — ABNORMAL LOW (ref 135–145)
Total Bilirubin: 0.5 mg/dL (ref 0.3–1.2)
Total Protein: 8.5 g/dL — ABNORMAL HIGH (ref 6.5–8.1)

## 2020-04-11 LAB — CBC
HCT: 34.2 % — ABNORMAL LOW (ref 39.0–52.0)
Hemoglobin: 11.5 g/dL — ABNORMAL LOW (ref 13.0–17.0)
MCH: 33 pg (ref 26.0–34.0)
MCHC: 33.6 g/dL (ref 30.0–36.0)
MCV: 98.3 fL (ref 80.0–100.0)
Platelets: 198 10*3/uL (ref 150–400)
RBC: 3.48 MIL/uL — ABNORMAL LOW (ref 4.22–5.81)
RDW: 13.2 % (ref 11.5–15.5)
WBC: 6.1 10*3/uL (ref 4.0–10.5)
nRBC: 0 % (ref 0.0–0.2)

## 2020-04-11 NOTE — ED Triage Notes (Signed)
Pt here from home with c/o nosebleed from his left nostril since Friday , b/p is elevated , no  Trauma noted

## 2020-04-11 NOTE — ED Notes (Signed)
Called x 2 no answer

## 2020-04-17 ENCOUNTER — Emergency Department (HOSPITAL_COMMUNITY)
Admission: EM | Admit: 2020-04-17 | Discharge: 2020-04-18 | Disposition: A | Discharge status: Home / Self Care | Payer: Self-pay | Attending: Emergency Medicine | Admitting: Emergency Medicine

## 2020-04-17 DIAGNOSIS — Z5321 Procedure and treatment not carried out due to patient leaving prior to being seen by health care provider: Secondary | ICD-10-CM | POA: Insufficient documentation

## 2020-04-17 DIAGNOSIS — R040 Epistaxis: Secondary | ICD-10-CM | POA: Insufficient documentation
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
## 2020-04-18 NOTE — ED Notes (Signed)
Patient called multiple times for vitals recheck with no resonse

## 2020-06-30 ENCOUNTER — Other Ambulatory Visit: Payer: Self-pay

## 2020-11-13 ENCOUNTER — Emergency Department (HOSPITAL_COMMUNITY): Payer: Self-pay

## 2020-11-13 ENCOUNTER — Other Ambulatory Visit: Payer: Self-pay

## 2020-11-13 ENCOUNTER — Inpatient Hospital Stay (HOSPITAL_COMMUNITY): Payer: Self-pay

## 2020-11-13 ENCOUNTER — Inpatient Hospital Stay (HOSPITAL_COMMUNITY)
Admission: EM | Admit: 2020-11-13 | Discharge: 2020-12-14 | DRG: 064 | Disposition: E | Payer: Self-pay | Attending: Neurology | Admitting: Neurology

## 2020-11-13 DIAGNOSIS — E873 Alkalosis: Secondary | ICD-10-CM | POA: Diagnosis present

## 2020-11-13 DIAGNOSIS — G935 Compression of brain: Secondary | ICD-10-CM | POA: Diagnosis present

## 2020-11-13 DIAGNOSIS — R680 Hypothermia, not associated with low environmental temperature: Secondary | ICD-10-CM | POA: Diagnosis present

## 2020-11-13 DIAGNOSIS — F1010 Alcohol abuse, uncomplicated: Secondary | ICD-10-CM | POA: Diagnosis present

## 2020-11-13 DIAGNOSIS — G8194 Hemiplegia, unspecified affecting left nondominant side: Secondary | ICD-10-CM | POA: Diagnosis present

## 2020-11-13 DIAGNOSIS — K760 Fatty (change of) liver, not elsewhere classified: Secondary | ICD-10-CM | POA: Diagnosis present

## 2020-11-13 DIAGNOSIS — Z515 Encounter for palliative care: Secondary | ICD-10-CM

## 2020-11-13 DIAGNOSIS — J13 Pneumonia due to Streptococcus pneumoniae: Secondary | ICD-10-CM | POA: Diagnosis not present

## 2020-11-13 DIAGNOSIS — I610 Nontraumatic intracerebral hemorrhage in hemisphere, subcortical: Secondary | ICD-10-CM

## 2020-11-13 DIAGNOSIS — Z66 Do not resuscitate: Secondary | ICD-10-CM | POA: Diagnosis not present

## 2020-11-13 DIAGNOSIS — E876 Hypokalemia: Secondary | ICD-10-CM | POA: Diagnosis not present

## 2020-11-13 DIAGNOSIS — D696 Thrombocytopenia, unspecified: Secondary | ICD-10-CM | POA: Diagnosis not present

## 2020-11-13 DIAGNOSIS — I10 Essential (primary) hypertension: Secondary | ICD-10-CM | POA: Diagnosis present

## 2020-11-13 DIAGNOSIS — Z20822 Contact with and (suspected) exposure to covid-19: Secondary | ICD-10-CM | POA: Diagnosis present

## 2020-11-13 DIAGNOSIS — Z9289 Personal history of other medical treatment: Secondary | ICD-10-CM

## 2020-11-13 DIAGNOSIS — G936 Cerebral edema: Secondary | ICD-10-CM | POA: Diagnosis present

## 2020-11-13 DIAGNOSIS — I615 Nontraumatic intracerebral hemorrhage, intraventricular: Principal | ICD-10-CM | POA: Diagnosis present

## 2020-11-13 DIAGNOSIS — R509 Fever, unspecified: Secondary | ICD-10-CM

## 2020-11-13 DIAGNOSIS — D62 Acute posthemorrhagic anemia: Secondary | ICD-10-CM | POA: Diagnosis not present

## 2020-11-13 DIAGNOSIS — E870 Hyperosmolality and hypernatremia: Secondary | ICD-10-CM | POA: Diagnosis not present

## 2020-11-13 DIAGNOSIS — R569 Unspecified convulsions: Secondary | ICD-10-CM

## 2020-11-13 DIAGNOSIS — R4701 Aphasia: Secondary | ICD-10-CM | POA: Diagnosis present

## 2020-11-13 DIAGNOSIS — G40901 Epilepsy, unspecified, not intractable, with status epilepticus: Secondary | ICD-10-CM | POA: Diagnosis present

## 2020-11-13 DIAGNOSIS — R29721 NIHSS score 21: Secondary | ICD-10-CM | POA: Diagnosis present

## 2020-11-13 DIAGNOSIS — N179 Acute kidney failure, unspecified: Secondary | ICD-10-CM | POA: Diagnosis present

## 2020-11-13 DIAGNOSIS — J9601 Acute respiratory failure with hypoxia: Secondary | ICD-10-CM | POA: Diagnosis present

## 2020-11-13 DIAGNOSIS — J9602 Acute respiratory failure with hypercapnia: Secondary | ICD-10-CM

## 2020-11-13 DIAGNOSIS — R414 Neurologic neglect syndrome: Secondary | ICD-10-CM | POA: Diagnosis present

## 2020-11-13 DIAGNOSIS — F1910 Other psychoactive substance abuse, uncomplicated: Secondary | ICD-10-CM | POA: Diagnosis present

## 2020-11-13 DIAGNOSIS — G9349 Other encephalopathy: Secondary | ICD-10-CM | POA: Diagnosis present

## 2020-11-13 DIAGNOSIS — I629 Nontraumatic intracranial hemorrhage, unspecified: Secondary | ICD-10-CM

## 2020-11-13 DIAGNOSIS — I619 Nontraumatic intracerebral hemorrhage, unspecified: Secondary | ICD-10-CM | POA: Diagnosis present

## 2020-11-13 DIAGNOSIS — Z0189 Encounter for other specified special examinations: Secondary | ICD-10-CM

## 2020-11-13 LAB — DIFFERENTIAL
Abs Immature Granulocytes: 0.06 10*3/uL (ref 0.00–0.07)
Basophils Absolute: 0.1 10*3/uL (ref 0.0–0.1)
Basophils Relative: 0 %
Eosinophils Absolute: 0 10*3/uL (ref 0.0–0.5)
Eosinophils Relative: 0 %
Immature Granulocytes: 0 %
Lymphocytes Relative: 1 %
Lymphs Abs: 0.1 10*3/uL — ABNORMAL LOW (ref 0.7–4.0)
Monocytes Absolute: 0.8 10*3/uL (ref 0.1–1.0)
Monocytes Relative: 6 %
Neutro Abs: 12.7 10*3/uL — ABNORMAL HIGH (ref 1.7–7.7)
Neutrophils Relative %: 93 %

## 2020-11-13 LAB — I-STAT ARTERIAL BLOOD GAS, ED
Acid-Base Excess: 9 mmol/L — ABNORMAL HIGH (ref 0.0–2.0)
Bicarbonate: 33.3 mmol/L — ABNORMAL HIGH (ref 20.0–28.0)
Calcium, Ion: 1.11 mmol/L — ABNORMAL LOW (ref 1.15–1.40)
HCT: 29 % — ABNORMAL LOW (ref 39.0–52.0)
Hemoglobin: 9.9 g/dL — ABNORMAL LOW (ref 13.0–17.0)
O2 Saturation: 100 %
Patient temperature: 96.3
Potassium: 3.9 mmol/L (ref 3.5–5.1)
Sodium: 133 mmol/L — ABNORMAL LOW (ref 135–145)
TCO2: 35 mmol/L — ABNORMAL HIGH (ref 22–32)
pCO2 arterial: 40.8 mmHg (ref 32.0–48.0)
pH, Arterial: 7.515 — ABNORMAL HIGH (ref 7.350–7.450)
pO2, Arterial: 562 mmHg — ABNORMAL HIGH (ref 83.0–108.0)

## 2020-11-13 LAB — I-STAT CHEM 8, ED
BUN: 11 mg/dL (ref 8–23)
Calcium, Ion: 1.06 mmol/L — ABNORMAL LOW (ref 1.15–1.40)
Chloride: 99 mmol/L (ref 98–111)
Creatinine, Ser: 1 mg/dL (ref 0.61–1.24)
Glucose, Bld: 225 mg/dL — ABNORMAL HIGH (ref 70–99)
HCT: 34 % — ABNORMAL LOW (ref 39.0–52.0)
Hemoglobin: 11.6 g/dL — ABNORMAL LOW (ref 13.0–17.0)
Potassium: 3.5 mmol/L (ref 3.5–5.1)
Sodium: 136 mmol/L (ref 135–145)
TCO2: 20 mmol/L — ABNORMAL LOW (ref 22–32)

## 2020-11-13 LAB — RESP PANEL BY RT-PCR (FLU A&B, COVID) ARPGX2
Influenza A by PCR: NEGATIVE
Influenza B by PCR: NEGATIVE
SARS Coronavirus 2 by RT PCR: NEGATIVE

## 2020-11-13 LAB — CBC
HCT: 31.6 % — ABNORMAL LOW (ref 39.0–52.0)
Hemoglobin: 9.4 g/dL — ABNORMAL LOW (ref 13.0–17.0)
MCH: 21 pg — ABNORMAL LOW (ref 26.0–34.0)
MCHC: 29.7 g/dL — ABNORMAL LOW (ref 30.0–36.0)
MCV: 70.5 fL — ABNORMAL LOW (ref 80.0–100.0)
Platelets: 161 10*3/uL (ref 150–400)
RBC: 4.48 MIL/uL (ref 4.22–5.81)
RDW: 21.1 % — ABNORMAL HIGH (ref 11.5–15.5)
WBC: 13.7 10*3/uL — ABNORMAL HIGH (ref 4.0–10.5)
nRBC: 0 % (ref 0.0–0.2)

## 2020-11-13 LAB — APTT: aPTT: 28 seconds (ref 24–36)

## 2020-11-13 LAB — RAPID URINE DRUG SCREEN, HOSP PERFORMED
Amphetamines: NOT DETECTED
Barbiturates: NOT DETECTED
Benzodiazepines: POSITIVE — AB
Cocaine: NOT DETECTED
Opiates: NOT DETECTED
Tetrahydrocannabinol: NOT DETECTED

## 2020-11-13 LAB — PROTIME-INR
INR: 1.1 (ref 0.8–1.2)
Prothrombin Time: 13.8 seconds (ref 11.4–15.2)

## 2020-11-13 LAB — SODIUM: Sodium: 137 mmol/L (ref 135–145)

## 2020-11-13 LAB — ETHANOL: Alcohol, Ethyl (B): <10 mg/dL (ref <10)

## 2020-11-13 LAB — MRSA PCR SCREENING: MRSA by PCR: NEGATIVE

## 2020-11-13 MED ORDER — CHLORHEXIDINE GLUCONATE 0.12% ORAL RINSE (MEDLINE KIT)
15.0000 mL | Freq: Two times a day (BID) | OROMUCOSAL | Status: DC
  Administered 2020-11-13 – 2020-11-16 (×6): 15 mL via OROMUCOSAL

## 2020-11-13 MED ORDER — SODIUM CHLORIDE 0.9 % IV SOLN
3000.0000 mg | Freq: Once | INTRAVENOUS | Status: AC
  Administered 2020-11-13: 3000 mg via INTRAVENOUS

## 2020-11-13 MED ORDER — ORAL CARE MOUTH RINSE
15.0000 mL | OROMUCOSAL | Status: DC
  Administered 2020-11-13 – 2020-11-16 (×29): 15 mL via OROMUCOSAL

## 2020-11-13 MED ORDER — LEVETIRACETAM IN NACL 1500 MG/100ML IV SOLN
1500.0000 mg | Freq: Two times a day (BID) | INTRAVENOUS | Status: DC
  Administered 2020-11-13 – 2020-11-15 (×5): 1500 mg via INTRAVENOUS
  Filled 2020-11-13 (×5): qty 100

## 2020-11-13 MED ORDER — ACETAMINOPHEN 160 MG/5ML PO SOLN
650.0000 mg | ORAL | Status: DC | PRN
  Administered 2020-11-14 – 2020-11-16 (×7): 650 mg
  Filled 2020-11-13 (×8): qty 20.3

## 2020-11-13 MED ORDER — LABETALOL HCL 5 MG/ML IV SOLN
INTRAVENOUS | Status: AC
  Filled 2020-11-13: qty 4

## 2020-11-13 MED ORDER — CLEVIDIPINE BUTYRATE 0.5 MG/ML IV EMUL
0.0000 mg/h | INTRAVENOUS | Status: DC
  Administered 2020-11-13 (×2): 18 mg/h via INTRAVENOUS
  Administered 2020-11-13: 16 mg/h via INTRAVENOUS
  Administered 2020-11-13: 18 mg/h via INTRAVENOUS
  Administered 2020-11-14 (×2): 8 mg/h via INTRAVENOUS
  Administered 2020-11-14: 18 mg/h via INTRAVENOUS
  Administered 2020-11-14: 14 mg/h via INTRAVENOUS
  Administered 2020-11-14: 13 mg/h via INTRAVENOUS
  Administered 2020-11-14: 7 mg/h via INTRAVENOUS
  Administered 2020-11-15: 10 mg/h via INTRAVENOUS
  Administered 2020-11-15: 14 mg/h via INTRAVENOUS
  Administered 2020-11-15: 13 mg/h via INTRAVENOUS
  Administered 2020-11-15: 12 mg/h via INTRAVENOUS
  Administered 2020-11-15: 8 mg/h via INTRAVENOUS
  Administered 2020-11-15: 12 mg/h via INTRAVENOUS
  Administered 2020-11-16 (×2): 16 mg/h via INTRAVENOUS
  Administered 2020-11-16: 18 mg/h via INTRAVENOUS
  Filled 2020-11-13 (×17): qty 100

## 2020-11-13 MED ORDER — ACETAMINOPHEN 325 MG PO TABS: 650.0000 mg | ORAL_TABLET | ORAL | Status: DC | PRN

## 2020-11-13 MED ORDER — PROPOFOL 1000 MG/100ML IV EMUL
INTRAVENOUS | Status: AC
  Administered 2020-11-13: 20 ug/kg/min via INTRAVENOUS
  Filled 2020-11-13: qty 100

## 2020-11-13 MED ORDER — ETOMIDATE 2 MG/ML IV SOLN
INTRAVENOUS | Status: AC | PRN
  Administered 2020-11-13: 20 mg via INTRAVENOUS

## 2020-11-13 MED ORDER — LORAZEPAM 2 MG/ML IJ SOLN
INTRAMUSCULAR | Status: AC
  Administered 2020-11-13: 2 mg via INTRAVENOUS
  Filled 2020-11-13: qty 1

## 2020-11-13 MED ORDER — ACETAMINOPHEN 650 MG RE SUPP: 650.0000 mg | RECTAL | Status: DC | PRN

## 2020-11-13 MED ORDER — MANNITOL 25 % IV SOLN: 80.0000 g | Freq: Once | INTRAVENOUS | Status: DC

## 2020-11-13 MED ORDER — CHLORHEXIDINE GLUCONATE CLOTH 2 % EX PADS
6.0000 | MEDICATED_PAD | Freq: Every day | CUTANEOUS | Status: DC
  Administered 2020-11-13 – 2020-11-15 (×3): 6 via TOPICAL

## 2020-11-13 MED ORDER — SODIUM CHLORIDE 0.9 % IV SOLN
200.0000 mg | Freq: Once | INTRAVENOUS | Status: AC
  Administered 2020-11-13: 200 mg via INTRAVENOUS
  Filled 2020-11-13 (×2): qty 20

## 2020-11-13 MED ORDER — MIDAZOLAM 50MG/50ML (1MG/ML) PREMIX INFUSION
0.0000 mg/h | INTRAVENOUS | Status: DC
  Administered 2020-11-13: 5 mg/h via INTRAVENOUS
  Administered 2020-11-13: 6 mg/h via INTRAVENOUS
  Administered 2020-11-13 – 2020-11-14 (×2): 8 mg/h via INTRAVENOUS
  Filled 2020-11-13 (×4): qty 50

## 2020-11-13 MED ORDER — SODIUM CHLORIDE 0.9% FLUSH: 3.0000 mL | Freq: Once | INTRAVENOUS | Status: DC

## 2020-11-13 MED ORDER — IOHEXOL 350 MG/ML SOLN
60.0000 mL | Freq: Once | INTRAVENOUS | Status: AC | PRN
  Administered 2020-11-13: 60 mL via INTRAVENOUS

## 2020-11-13 MED ORDER — PANTOPRAZOLE SODIUM 40 MG IV SOLR
40.0000 mg | Freq: Every day | INTRAVENOUS | Status: DC
  Administered 2020-11-13 – 2020-11-15 (×3): 40 mg via INTRAVENOUS
  Filled 2020-11-13 (×3): qty 40

## 2020-11-13 MED ORDER — LORAZEPAM 2 MG/ML IJ SOLN
2.0000 mg | Freq: Once | INTRAMUSCULAR | Status: AC
  Administered 2020-11-13: 2 mg via INTRAVENOUS

## 2020-11-13 MED ORDER — STROKE: EARLY STAGES OF RECOVERY BOOK
Freq: Once | Status: AC
  Filled 2020-11-13: qty 1

## 2020-11-13 MED ORDER — SODIUM CHLORIDE 3 % IV SOLN
INTRAVENOUS | Status: DC
  Filled 2020-11-13 (×8): qty 500

## 2020-11-13 MED ORDER — LORAZEPAM 2 MG/ML IJ SOLN: 2.0000 mg | Freq: Once | INTRAMUSCULAR | Status: AC

## 2020-11-13 MED ORDER — PROPOFOL 1000 MG/100ML IV EMUL
INTRAVENOUS | Status: AC
  Filled 2020-11-13: qty 100

## 2020-11-13 MED ORDER — LABETALOL HCL 5 MG/ML IV SOLN
5.0000 mg | Freq: Once | INTRAVENOUS | Status: AC
  Administered 2020-11-13: 5 mg via INTRAVENOUS

## 2020-11-13 MED ORDER — SUCCINYLCHOLINE CHLORIDE 20 MG/ML IJ SOLN
INTRAMUSCULAR | Status: AC | PRN
  Administered 2020-11-13: 120 mg via INTRAVENOUS

## 2020-11-13 MED ORDER — LORAZEPAM 2 MG/ML IJ SOLN: 2.0000 mg | Freq: Once | INTRAMUSCULAR | Status: DC | PRN

## 2020-11-13 MED ORDER — MANNITOL 20 % IV SOLN
80.0000 g | Freq: Once | INTRAVENOUS | Status: AC
  Administered 2020-11-13: 80 g via INTRAVENOUS
  Filled 2020-11-13: qty 500

## 2020-11-13 MED ORDER — MIDAZOLAM BOLUS VIA INFUSION
1.0000 mg | INTRAVENOUS | Status: DC | PRN
Start: 2020-11-13 — End: 2020-11-16
  Filled 2020-11-13: qty 2

## 2020-11-13 MED ORDER — PROPOFOL 1000 MG/100ML IV EMUL
INTRAVENOUS | Status: AC
  Administered 2020-11-13: 50 ug/kg/min
  Filled 2020-11-13: qty 100

## 2020-11-13 MED ORDER — PROPOFOL 1000 MG/100ML IV EMUL
5.0000 ug/kg/min | INTRAVENOUS | Status: DC
  Administered 2020-11-14: 20 ug/kg/min via INTRAVENOUS
  Administered 2020-11-14: 30 ug/kg/min via INTRAVENOUS
  Administered 2020-11-14: 10 ug/kg/min via INTRAVENOUS
  Administered 2020-11-15 (×2): 30 ug/kg/min via INTRAVENOUS
  Administered 2020-11-15: 20 ug/kg/min via INTRAVENOUS
  Administered 2020-11-16: 30 ug/kg/min via INTRAVENOUS
  Filled 2020-11-13 (×8): qty 100

## 2020-11-13 MED ORDER — LORAZEPAM 2 MG/ML IJ SOLN
INTRAMUSCULAR | Status: AC
  Filled 2020-11-13: qty 1

## 2020-11-13 MED ORDER — CLEVIDIPINE BUTYRATE 0.5 MG/ML IV EMUL
INTRAVENOUS | Status: AC
  Administered 2020-11-13: 1 mg/h
  Filled 2020-11-13: qty 50

## 2020-11-13 NOTE — ED Triage Notes (Addendum)
Paged for Code Stroke    BIB EMS after family found patient mute with right gaze and left neglect.  Patient had vomitted upon arrival.  Unknown LKW but was told possibly 1600 on 4/30 and unable to answer any questions or follow commands.  Some movement to right arm with resistance.

## 2020-11-13 NOTE — Progress Notes (Signed)
Notified by RN that patient continues to have intermittent RUE rhythmic twitching. On-call EEG tech is on way to hook him up to EEG.  - Loaded with Vimpat 200mg  IV once. PR interval of 142.  [facility] Pager Number Erick Blinks

## 2020-11-13 NOTE — TOC Initial Note (Addendum)
Transition of Care [facility]) - Initial/Assessment Note    Patient Details  Name: Stephen Chung MRN: [id number] Date of Birth: 07/16/1873  Transition of Care [facility]) CM/SW Contact:    Lockie Pares, RN Phone Number: 05-Dec-2020, 1:44 PM  Clinical Narrative:                  [facility] came in from [facility] with stroke like symptoms was called by neuro to investigate, as patient family did not know his last name? The patient is in critical condition and has a high chance of herniation. Discussed with CSW, called EMS, they are speaking with supervisor, supervisor will call this CM back with details, We need to send PD to place of residence to get family for GOC discussion. ASAP, and to properly identify patient. Hilda Lias in neuro is contact [phone].   [phone] PD called back will be going to place of pick up to gather more information on identity and family. They will call this RNCM back with any information.  Expected Discharge Plan: Home/Self Care     Patient Goals and CMS Choice        Expected Discharge Plan and Services Expected Discharge Plan: Home/Self Care                                              Prior Living Arrangements/Services                       Activities of Daily Living      Permission Sought/Granted                  Emotional Assessment              Admission diagnosis:  ICH (intracerebral hemorrhage) (HCC) [I61.9] Patient Active Problem List   Diagnosis Date Noted  . ICH (intracerebral hemorrhage) (HCC) 12-05-20   PCP:  No primary care provider on file. Pharmacy:  No Pharmacies Listed    Social Determinants of Health (SDOH) Interventions    Readmission Risk Interventions No flowsheet data found.

## 2020-11-13 NOTE — ED Notes (Signed)
Neurosurgery PA at bedside.

## 2020-11-13 NOTE — Progress Notes (Signed)
Patient to 4N24 at 1559, responsive to pain with reflexive movements on arrival. Patient with one pair of black socks, and $1.10 in change (8 dimes, 4 nickels, 10 pennies).  Aris Lot, RN

## 2020-11-13 NOTE — Progress Notes (Signed)
Patient noted to have RUE muscular twitching in a rhythmic pattern, Dr. Selina Cooley notified and V/O to add Propofol gtt.   Aris Lot, RN

## 2020-11-13 NOTE — Progress Notes (Addendum)
Paged for code stroke patient LKW 1600 yesterday NIH 28  Right gaze, vomiting, mute and seized in CT and vomited  +CT hemorrhage right hemisphere  Assisted with med management of labetolol, mannitol, cleviprex, propfolol gtt, keppra bolus versed gtt and ativan pushes  and intubation

## 2020-11-13 NOTE — Consult Note (Signed)
NAMEShaune Chung, MRN:  [id number], DOB:  07/16/1873, LOS: 0 ADMISSION DATE:  12/11/2020, CONSULTATION DATE:  11/25/2020 REFERRING MD:  Dr. Selina Cooley, CHIEF COMPLAINT:  Ventilator management    History of Present Illness:  Stephen Chung is a 60 y.o. male with unknown PMH who presented to the emergency department after being found with left-sided weakness and aphasia. On EMS arrival patient was able to move right UE spontaneously but unable to follow commands. Last known normal time was unknown.   On arrival vital signs significant for hypothermia with all vital signs stable. Labwork significant for glucose 214, creatine 1.27, anion gap 20, AST 81, wbc 13.7, hgb 9.4. Stroke head CT and CTA head and neck with acute intraparenchymal hemorrhage. During imaging patient vomited and began having seizure like activity therefore patient was intubated for airway protection and PCCM was consulted for vent management. NSG was consulted and did not feel as if evacuation was needed as ICH was likely not a survivable.   Pertinent  Medical History  Unknown   Significant Hospital Events: Including procedures, antibiotic start and stop dates in addition to other pertinent events   . 12/10/2020 admitted after being found  Interim History / Subjective:  As above   Objective   Blood pressure 120/66, pulse (!) 103, temperature (!) 101.8 F (38.8 C), temperature source Oral, resp. rate 20, height 5\' 4"  (1.626 m), weight 71.7 kg, SpO2 100 %.    Vent Mode: PRVC FiO2 (%):  [50 %-100 %] 50 % Set Rate:  [16 bmp] 16 bmp Vt Set:  [470 mL] 470 mL PEEP:  [5 cmH20] 5 cmH20 Plateau Pressure:  [13 cmH20-14 cmH20] 13 cmH20   Intake/Output Summary (Last 24 hours) at 11/27/2020 2152 Last data filed at 12/08/2020 1900 Gross per 24 hour  Intake 1490.41 ml  Output 450 ml  Net 1040.41 ml   Filed Weights   11/23/2020 1256 12/04/2020 1600  Weight: 73 kg 71.7 kg    Examination: General: Acute on chronic ill appearing middle  aged appearing male on mechanical ventilation, in NAD HEENT: ETT, MM pink/moist, PERRL,  Neuro: Slight response to painful stimuli, sedated on vent CV: s1s2 regular rate and rhythm, no murmur, rubs, or gallops,  PULM:  Clear to ascultation bilaterally, tolerating vent well, no added breath sounds GI: soft, bowel sounds active in all 4 quadrants, non-tender, non-distended, Extremities: warm/dry, no edema  Skin: no rashes or lesions  Labs/imaging that I havepersonally reviewed    Head CTA 11/23/2020 > No evidence of large or medium vessel occlusion, significant atherosclerotic vascular disease, aneurysm or vascular malformation. Large right intraparenchymal hemorrhage with intravascular penetration as shown by immediate previous head CT.  Resolved Hospital Problem list     Assessment & Plan:  Large acute basal ganglia  ICU with midline shift  -NSU evaluated and did not feel evacuation was indicated given likely nonsurvival bleed  P: Management per neurology  Maintain neuro protective measures; goal for eurothermia, euglycemia, eunatermia, normoxia, and PCO2 goal of 35-40 Nutrition and bowel regiment  Seizure precautions  AEDs per neurology  Aspirations precautions  EEG Family communication as able  B/P management per neuro  HTS per neuro  No acute need or indication for central line placement at this time   Acute Hypoxic Respiratory Failure  -In the setting of above  P: Continue ventilator support with lung protective strategies  Wean PEEP and FiO2 for sats greater than 90%. Head of bed elevated 30 degrees. Plateau pressures less than  30 cm H20.  Follow intermittent chest x-ray and ABG.   Ensure adequate pulmonary hygiene  VAP bundle in place  PAD protocol  Rest of care managed per primary   Best practice   Diet:  NPO Pain/Anxiety/Delirium protocol (if indicated): Yes (RASS goal -1) VAP protocol (if indicated): Yes DVT prophylaxis: Contraindicated GI prophylaxis:  PPI Glucose control:  SSI Yes Central venous access:  N/A Arterial line:  N/A Foley:  Yes, and it is still needed Mobility:  bed rest  PT consulted: Yes Last date of multidisciplinary goals of care discussion Pending  Code Status:  full code Disposition: ICU  Labs   CBC: Recent Labs  Lab 12/11/2020 1148 12/11/2020 1152 12/08/2020 1427  WBC 13.7*  --   --   NEUTROABS 12.7*  --   --   HGB 9.4* 11.6* 9.9*  HCT 31.6* 34.0* 29.0*  MCV 70.5*  --   --   PLT 161  --   --     Basic Metabolic Panel: Recent Labs  Lab 12/09/2020 1148 12/12/2020 1152 12/08/2020 1427 11/28/2020 1836  NA 136 136 133* 137  K 3.5 3.5 3.9  --   CL 97* 99  --   --   CO2 19*  --   --   --   GLUCOSE 214* 225*  --   --   BUN 12 11  --   --   CREATININE 1.27* 1.00  --   --   CALCIUM 9.1  --   --   --    GFR: Estimated Creatinine Clearance: -4.5 mL/min (by C-G formula based on SCr of 1 mg/dL). Recent Labs  Lab 11/24/2020 1148  WBC 13.7*    Liver Function Tests: Recent Labs  Lab 12/10/2020 1148  AST 81*  ALT 40  ALKPHOS 95  BILITOT 0.8  PROT 9.2*  ALBUMIN 4.1   No results for input(s): LIPASE, AMYLASE in the last 168 hours. No results for input(s): AMMONIA in the last 168 hours.  ABG    Component Value Date/Time   PHART 7.515 (H) 11/17/2020 1427   PCO2ART 40.8 12/07/2020 1427   PO2ART 562 (H) 11/29/2020 1427   HCO3 33.3 (H) 11/21/2020 1427   TCO2 35 (H) 11/19/2020 1427   O2SAT 100.0 12/13/2020 1427     Coagulation Profile: Recent Labs  Lab 12/09/2020 1148  INR 1.1    Cardiac Enzymes: No results for input(s): CKTOTAL, CKMB, CKMBINDEX, TROPONINI in the last 168 hours.  HbA1C: No results found for: HGBA1C  CBG: No results for input(s): GLUCAP in the last 168 hours.  Review of Systems:   Unable to gather given acute critical illness   Past Medical History:  He,  has no past medical history on file.   Surgical History:     Social History:      Family History:  His family history  is not on file.   Allergies Not on File   Home Medications  Prior to Admission medications   Not on File     Critical care time:    Performed by: Delfin Gant  Total critical care time: 42 minutes  Critical care time was exclusive of separately billable procedures and treating other patients.  Critical care was necessary to treat or prevent imminent or life-threatening deterioration.  Critical care was time spent personally by me on the following activities: development of treatment plan with patient and/or surrogate as well as nursing, discussions with consultants, evaluation of patient's response to treatment,  examination of patient, obtaining history from patient or surrogate, ordering and performing treatments and interventions, ordering and review of laboratory studies, ordering and review of radiographic studies, pulse oximetry and re-evaluation of patient's condition.  Delfin Gant, NP-C [facility] Personal contact information can be found on Amion  11/20/2020, 10:53 PM

## 2020-11-13 NOTE — TOC Progression Note (Addendum)
Transition of Care [facility]) - Progression Note    Patient Details  Name: Stephen Chung MRN: [id number] Date of Birth: 07/16/1873  [facility] CM/SW Contact  Lockie Pares, RN Phone Number: 11/19/2020, 3:04 PM  Clinical Narrative:    Received a call from [facility] PD. They got in touch with  The patient boss. The corect spelling of his name is Stephen Chung. He has a son who lives in [place], unknown number and name. He also has a male friend who lives in [place]. The boss is working with PD to get information. PD will call this CM back with any additional information. No Birthrate available Hilda Lias from Neuro updated with this information. .   Expected Discharge Plan: Home/Self Care    Expected Discharge Plan and Services Expected Discharge Plan: Home/Self Care                                               Social Determinants of Health (SDOH) Interventions    Readmission Risk Interventions No flowsheet data found.

## 2020-11-13 NOTE — Consult Note (Addendum)
Chief Complaint   No chief complaint on file.   HPI   Consult requested by: Dr Ardeen Jourdain Reason for consult: ICH Time of consult: 1209 Time of arrival: 1227 - initially went to Room 1, but patient was moved to Trauma B  HPI: Stephen Chung is a 60 y.o. male with no known past medical history who presented to the ED after being found unresponsive. No history known. Patient was taken emergently as a code stroke to head CT. He seizure while in CT. Head CT revealed a large basal ganglionic hemorrhage. Intubated. A NSY consultation was requested. I arrived at the bedside <20 minutes. Neurology team also at bedside.  There are no problems to display for this patient.   PMH: No past medical history on file.  PSH: (Not in a hospital admission)   SH:    MEDS: Prior to Admission medications   Not on File    ALLERGY: Not on File  Social History   Tobacco Use  . Smoking status: Not on file  . Smokeless tobacco: Not on file  Substance Use Topics  . Alcohol use: Not on file     No family history on file.   ROS   Review of Systems  Unable to perform ROS: Intubated    Exam   Vitals:   11/23/2020 1214 11/28/2020 1230  BP: (!) 248/137 119/85  Pulse:  98  Resp:  (!) 29  SpO2:  100%   Intubated Left pupil 81mm, nonreactive Right pupil 65mm, sluggishly reactive Mixed almost flexor/extensor posturing spontaneously LLE No corneal, no gag, no cough  Results - Imaging/Labs   Results for orders placed or performed during the hospital encounter of 11/17/2020 (from the past 48 hour(s))  Protime-INR     Status: None   Collection Time: 12/09/2020 11:48 AM  Result Value Ref Range   Prothrombin Time 13.8 11.4 - 15.2 seconds   INR 1.1 0.8 - 1.2    Comment: (NOTE) INR goal varies based on device and disease states. Performed at [facility], [address] [place], [place] [address]   APTT     Status: None   Collection Time: 11/14/2020 11:48 AM  Result Value Ref Range    aPTT 28 24 - 36 seconds    Comment: Performed at [facility], [address] [place], [place] [address]  CBC     Status: Abnormal   Collection Time: 11/15/2020 11:48 AM  Result Value Ref Range   WBC 13.7 (H) 4.0 - 10.5 K/uL   RBC 4.48 4.22 - 5.81 MIL/uL   Hemoglobin 9.4 (L) 13.0 - 17.0 g/dL   HCT 29.7 (L) 98.9 - 21.1 %   MCV 70.5 (L) 80.0 - 100.0 fL   MCH 21.0 (L) 26.0 - 34.0 pg   MCHC 29.7 (L) 30.0 - 36.0 g/dL   RDW 94.1 (H) 74.0 - 81.4 %   Platelets 161 150 - 400 K/uL    Comment: REPEATED TO VERIFY   nRBC 0.0 0.0 - 0.2 %    Comment: Performed at [facility], [address] [place], [place] [address]  Differential     Status: Abnormal   Collection Time: 12/13/2020 11:48 AM  Result Value Ref Range   Neutrophils Relative % 93 %   Neutro Abs 12.7 (H) 1.7 - 7.7 K/uL   Lymphocytes Relative 1 %   Lymphs Abs 0.1 (L) 0.7 - 4.0 K/uL   Monocytes Relative 6 %   Monocytes Absolute  0.8 0.1 - 1.0 K/uL   Eosinophils Relative 0 %   Eosinophils Absolute 0.0 0.0 - 0.5 K/uL   Basophils Relative 0 %   Basophils Absolute 0.1 0.0 - 0.1 K/uL   Immature Granulocytes 0 %   Abs Immature Granulocytes 0.06 0.00 - 0.07 K/uL    Comment: Performed at [facility], [address] [place], [place] [address]  I-stat chem 8, ED     Status: Abnormal   Collection Time: Dec 09, 2020 11:52 AM  Result Value Ref Range   Sodium 136 135 - 145 mmol/L   Potassium 3.5 3.5 - 5.1 mmol/L   Chloride 99 98 - 111 mmol/L   BUN 11 8 - 23 mg/dL   Creatinine, Ser 5.40 0.61 - 1.24 mg/dL   Glucose, Bld 086 (H) 70 - 99 mg/dL    Comment: Glucose reference range applies only to samples taken after fasting for at least 8 hours.   Calcium, Ion 1.06 (L) 1.15 - 1.40 mmol/L   TCO2 20 (L) 22 - 32 mmol/L   Hemoglobin 11.6 (L) 13.0 - 17.0 g/dL   HCT 76.1 (L) 95.0 - 93.2 %    CT HEAD CODE STROKE WO CONTRAST  Result Date: 09-Dec-2020 CLINICAL DATA:  Code stroke.  Acute stroke presentation EXAM: CT HEAD WITHOUT CONTRAST  TECHNIQUE: Contiguous axial images were obtained from the base of the skull through the vertex without intravenous contrast. COMPARISON:  None. FINDINGS: Brain: Acute intraparenchymal hemorrhage with the epicenter in the right basal ganglia measuring approximately 7.7 x 6.0 x 3.4 cm (volume = 82 cm^3). Hematoma dissects into the substance of the right temporal lobe communicates with the temporal horn of the right lateral ventricle. Mild surrounding edema. Intraventricular penetration with blood dependent in both lateral ventricles. Mass-effect with right-to-left midline shift of 9 mm. Elsewhere, brain appears intrinsically unremarkable. No evidence of old stroke. No extra-axial collection. Vascular: There is atherosclerotic calcification of the major vessels at the base of the brain. Skull: Negative Sinuses/Orbits: Clear/normal Other: None IMPRESSION: 1. Acute intraparenchymal hemorrhage in the right basal ganglia, dissecting into the right temporal lobe, with intraventricular communication. Mild surrounding edema. Approximate volume 82 cc. Mass effect with right-to-left shift of 9 mm and uncal herniation. 2. These results were communicated to Dr. Selina Cooley At 12:00 pmon 2022-05-27by text page via the [facility] messaging system. Electronically Signed   By: Paulina Fusi M.D.   On: 12-09-20 12:02   Impression/Plan   60 y.o. male found to have a large right basal ganglionic/thamalic hemorrhage with intraventricular extension and associated 45mm midline shift WITHOUT hydrocephalus. I have reviewed the imaging and case with attending Dr Conchita Paris. While this a large hemorrhage, given its location, we do not believe surgical evacuation is indicated as it will not likely change his outcome. There is also no indication for placement of EVD at this time. While we could consider an EVD if he develops hydrocephalus, I believe this is a devastating hemorrhage and likely fatal. Hopefully we can reach family and transition to comfort  care.  Cindra Presume, PA-C [facility]

## 2020-11-13 NOTE — H&P (Signed)
NEUROLOGY CONSULTATION NOTE   Date of service: 02-Dec-2020 Patient Name: Stephen Chung MRN:  [id number] DOB:  07/16/1873 Reason for consult: stroke code _ _ _   _ __   _ __ _ _  __ __   _ __   __ _  History of Present Illness   Ronaldo Baxios is a gentleman of unknown identity with no known past medical history was was BIB EMS for L-sided weakness and aphasia. On arrival patient was briefly able to move his RUE spontaneously but was never able to move any other extremity and would not follow commands. NIHSS = 21. LKW unknown. Unable to get in touch with family 2/2 patient's identity unknown.  CTH  Acute intraparenchymal hemorrhage in the right basal ganglia, dissecting into the right temporal lobe, with intraventricular communication. Mild surrounding edema. Approximate volume 82 cc. Mass effect with right-to-left shift of 9 mm and uncal herniation.  CTA H&N: no LVO, no aneurysm, no hemodynamically significant stenosis  CNS imaging perosnally reviewed  tPA not administered 2/2 ICH.   Patient vomited and began to have a seizure focal L-sided then generalized during CTH. He was intubated for airway protection. He received multiple rounds of ativan 2mg  and LEV 40 mg/kg. He received 80g mannitol. Clevidipine for goal SBP 140-160. Patient continued to seize after LEV on anesthesia with propofol therefore versed added as a second agent. NSU evaluated patient and felt that this was likely not a survivable event and that evacuation would not change outcome given severity of bleed and deep location of hemorrhage. EVD not currently indicated 2/2 no hydrocephalus. Patient admitted to ICU.   ROS   UTA 2/2 encephalopathy  Past History   Unknown 2/2 unknown patient identity  Medications   No medications prior to admission.     Vitals   Vitals:   02-Dec-2020 1603 Dec 02, 2020 1608 12/02/2020 1615 02-Dec-2020 1630  BP: (!) 152/85 138/80 135/77 126/76  Pulse:      Resp:      Temp:      TempSrc:       SpO2:      Weight:      Height:         Body mass index is 27.13 kg/m.  Physical Exam   Physical Exam CV: RRR Resp: labored breathing on nonrebreather then intubated for airway protection  Neuro: MS: patient initially but awake but lethargic, unable to follow commands Speech: mute with global aphasia CN: PERRL 51mm, (+) corneals and oculocephalics Motor & sensory: initially spontaneous movement of RUE only, after that no response to noxious stimuli  NIHSS  1a Level of Conscious.: 0 1b LOC Questions: 2 1c LOC Commands: 2 2 Best Gaze: 0 3 Visual: 0 4 Facial Palsy: 0 5a Motor Arm - left: 4 5b Motor Arm - Right: 2 6a Motor Leg - Left: 4 6b Motor Leg - Right: 4 7 Limb Ataxia: 0 8 Sensory: 0 9 Best Language: 3 10 Dysarthria: 0 11 Extinct. and Inatten.: 0  TOTAL: 21  ICH score = 3  Premorbid mRS = unknown   Labs   CBC:  Recent Labs  Lab 12-02-20 1148 12-02-20 1152 Dec 02, 2020 1427  WBC 13.7*  --   --   NEUTROABS 12.7*  --   --   HGB 9.4* 11.6* 9.9*  HCT 31.6* 34.0* 29.0*  MCV 70.5*  --   --   PLT 161  --   --     Basic Metabolic Panel:  Lab Results  Component Value Date   NA 133 (L) 11/26/2020   K 3.9 11/28/2020   CO2 19 (L) 12/04/2020   GLUCOSE 225 (H) 12/01/2020   BUN 11 11/22/2020   CREATININE 1.00 12/01/2020   CALCIUM 9.1 12/01/2020   GFRNONAA 38 (L) 11/17/2020   Lipid Panel: No results found for: LDLCALC HgbA1c: No results found for: HGBA1C Urine Drug Screen: No results found for: LABOPIA, COCAINSCRNUR, LABBENZ, AMPHETMU, THCU, LABBARB  Alcohol Level     Component Value Date/Time   ETH <10 11/20/2020 1148     Impression   Ronaldo Baxios is a gentleman of unknown identity with no known past medical history was was BIB EMS for L-sided weakness and aphasia found to have a 82cc R BG ICH w/ IVE, 15mm midline shift, and uncal herniation. Clinical status epilepticus during stroke code.   NSU evaluated patient and felt that this was likely  not a survivable event and that evacuation would not change outcome given severity of bleed and deep location of hemorrhage. EVD not currently indicated 2/2 no hydrocephalus.  Recommendations   - Admit to ICU under Dr. Selina Cooley - Clevidipine for goal SBP <160 - SCDs for DVT prophylaxis - Head CT q 6 hrs assess stability of ICH - STAT EEG f/b cEEG - S/p LEV 40mg /kg load f/b 1500mg  q 12 hrs - Intubated on propofol and versed - CTH q 6 hrs assess bleed stability - HOB elevated 30 degrees - Hypertonic saline 3% continuous PIV per protocol - Central line placement ordered - NSU following; will consider EVD if patient develops hydrocephalus - GOC discussion when family identified  Stroke team will assume care in AM.   This patient is critically ill and at significant risk of neurological worsening, death and care requires constant monitoring of vital signs, hemodynamics,respiratory and cardiac monitoring, neurological assessment, discussion with family, other specialists and medical decision making of high complexity. I spent 90 minutes of neurocritical care time  in the care of  this patient. This was time spent independent of any time provided by nurse practitioner or PA.  , MD [facility] [phone]  If 7pm- 7am, please page neurology on call as listed in AMION.

## 2020-11-13 NOTE — ED Provider Notes (Signed)
[facility] EMERGENCY DEPARTMENT Provider Note   CSN: [id number] Arrival date & time: 2020/12/01  1144     History Chief Complaint  Patient presents with  . Code Stroke    Stephen Chung is a 60 y.o. male.  Patient presented as a code stroke.  Uncertain last known normal.  Patient with obvious neurodeficit on arrival.  He was seen by neuro stroke team upon arrival.  CT imaging revealed significant intracranial hemorrhage.  Patient was immediately transported to trauma bay for intubation post CT.  Patient was unable to protect his airway.   Additional history was unable to be obtained given the patient's acuity and level of illness.  The history is provided by the patient and medical records.  Illness Location:  AMS Severity:  Severe Onset quality:  Unable to specify Timing:  Unable to specify Progression:  Unable to specify Chronicity:  New Associated symptoms: no fever        No past medical history on file.  Patient Active Problem List   Diagnosis Date Noted  . ICH (intracerebral hemorrhage) (HCC) Dec 01, 2020        No family history on file.     Home Medications Prior to Admission medications   Not on File    Allergies    Patient has no allergy information on record.  Review of Systems   Review of Systems  Constitutional: Negative for fever.  All other systems reviewed and are negative.   Physical Exam Updated Vital Signs BP 124/72   Pulse 100   Temp (!) 96.3 F (35.7 C) (Temporal)   Resp (!) 26   Ht 5\' 4"  (1.626 m)   Wt 73 kg   SpO2 100%   BMI 27.62 kg/m   Physical Exam Vitals and nursing note reviewed.  Constitutional:      General: He is not in acute distress.    Appearance: He is well-developed.     Comments: Minimally alert, rightward gaze  HENT:     Head: Normocephalic and atraumatic.  Eyes:     Conjunctiva/sclera: Conjunctivae normal.     Pupils: Pupils are equal, round, and reactive to light.   Cardiovascular:     Rate and Rhythm: Normal rate and regular rhythm.     Heart sounds: Normal heart sounds.  Pulmonary:     Effort: Pulmonary effort is normal. No respiratory distress.     Breath sounds: Normal breath sounds.  Abdominal:     General: There is no distension.     Palpations: Abdomen is soft.     Tenderness: There is no abdominal tenderness.  Musculoskeletal:        General: No deformity. Normal range of motion.     Cervical back: Normal range of motion and neck supple.  Skin:    General: Skin is warm and dry.  Neurological:     Comments: Not alert, rightward gaze     ED Results / Procedures / Treatments   Labs (all labs ordered are listed, but only abnormal results are displayed) Labs Reviewed  CBC - Abnormal; Notable for the following components:      Result Value   WBC 13.7 (*)    Hemoglobin 9.4 (*)    HCT 31.6 (*)    MCV 70.5 (*)    MCH 21.0 (*)    MCHC 29.7 (*)    RDW 21.1 (*)    All other components within normal limits  DIFFERENTIAL - Abnormal; Notable for the following components:  Neutro Abs 12.7 (*)    Lymphs Abs 0.1 (*)    All other components within normal limits  COMPREHENSIVE METABOLIC PANEL - Abnormal; Notable for the following components:   Chloride 97 (*)    CO2 19 (*)    Glucose, Bld 214 (*)    Creatinine, Ser 1.27 (*)    Total Protein 9.2 (*)    AST 81 (*)    GFR, Estimated 38 (*)    Anion gap 20 (*)    All other components within normal limits  I-STAT CHEM 8, ED - Abnormal; Notable for the following components:   Glucose, Bld 225 (*)    Calcium, Ion 1.06 (*)    TCO2 20 (*)    Hemoglobin 11.6 (*)    HCT 34.0 (*)    All other components within normal limits  RESP PANEL BY RT-PCR (FLU A&B, COVID) ARPGX2  PROTIME-INR  APTT  ETHANOL  RAPID URINE DRUG SCREEN, HOSP PERFORMED  SODIUM  SODIUM  SODIUM  CBG MONITORING, ED    EKG None  Radiology DG Chest Port 1 View  Result Date: 11/22/2020 CLINICAL DATA:  Intubated.   Code stroke. EXAM: PORTABLE CHEST 1 VIEW COMPARISON:  None. FINDINGS: An ET tube is been placed, in good position terminating in the mid trachea. The NG tube terminates below today's film. No pneumothorax. The cardiomediastinal silhouette is unremarkable. No pulmonary nodules, masses, or focal infiltrates. IMPRESSION: 1. Support apparatus as above. 2. No other abnormalities. Electronically Signed   By: Gerome Sam III M.D   On: 11/17/2020 12:54   CT HEAD CODE STROKE WO CONTRAST  Result Date: 12/11/2020 CLINICAL DATA:  Code stroke.  Acute stroke presentation EXAM: CT HEAD WITHOUT CONTRAST TECHNIQUE: Contiguous axial images were obtained from the base of the skull through the vertex without intravenous contrast. COMPARISON:  None. FINDINGS: Brain: Acute intraparenchymal hemorrhage with the epicenter in the right basal ganglia measuring approximately 7.7 x 6.0 x 3.4 cm (volume = 82 cm^3). Hematoma dissects into the substance of the right temporal lobe communicates with the temporal horn of the right lateral ventricle. Mild surrounding edema. Intraventricular penetration with blood dependent in both lateral ventricles. Mass-effect with right-to-left midline shift of 9 mm. Elsewhere, brain appears intrinsically unremarkable. No evidence of old stroke. No extra-axial collection. Vascular: There is atherosclerotic calcification of the major vessels at the base of the brain. Skull: Negative Sinuses/Orbits: Clear/normal Other: None IMPRESSION: 1. Acute intraparenchymal hemorrhage in the right basal ganglia, dissecting into the right temporal lobe, with intraventricular communication. Mild surrounding edema. Approximate volume 82 cc. Mass effect with right-to-left shift of 9 mm and uncal herniation. 2. These results were communicated to Dr. Selina Cooley At 12:00 pmon 05/31/2022by text page via the [facility] messaging system. Electronically Signed   By: Paulina Fusi M.D.   On: 11/27/2020 12:02    Procedures Procedure Name:  Intubation Date/Time: 11/17/2020 2:13 PM Performed by: Wynetta Fines, MD Pre-anesthesia Checklist: Patient identified, Patient being monitored, Emergency Drugs available, Timeout performed and Suction available Oxygen Delivery Method: Non-rebreather mask Preoxygenation: Pre-oxygenation with 100% oxygen Induction Type: Rapid sequence Ventilation: Mask ventilation without difficulty Laryngoscope Size: Glidescope Grade View: Grade I Number of attempts: 1 Placement Confirmation: ETT inserted through vocal cords under direct vision,  CO2 detector and Breath sounds checked- equal and bilateral Tube secured with: ETT holder Dental Injury: Teeth and Oropharynx as per pre-operative assessment        CRITICAL CARE Performed by: Wynetta Fines   Total critical  care time: 45 minutes  Critical care time was exclusive of separately billable procedures and treating other patients.  Critical care was necessary to treat or prevent imminent or life-threatening deterioration.  Critical care was time spent personally by me on the following activities: development of treatment plan with patient and/or surrogate as well as nursing, discussions with consultants, evaluation of patient's response to treatment, examination of patient, obtaining history from patient or surrogate, ordering and performing treatments and interventions, ordering and review of laboratory studies, ordering and review of radiographic studies, pulse oximetry and re-evaluation of patient's condition.     Medications Ordered in ED Medications  sodium chloride flush (NS) 0.9 % injection 3 mL (has no administration in time range)  LORazepam (ATIVAN) injection 2 mg (has no administration in time range)  midazolam (VERSED) 50 mg/50 mL (1 mg/mL) premix infusion (6 mg/hr Intravenous Rate/Dose Change 11/22/2020 1246)  midazolam (VERSED) bolus via infusion 1-2 mg (has no administration in time range)   stroke: mapping our early stages of  recovery book (has no administration in time range)  acetaminophen (TYLENOL) tablet 650 mg (has no administration in time range)    Or  acetaminophen (TYLENOL) 160 MG/5ML solution 650 mg (has no administration in time range)    Or  acetaminophen (TYLENOL) suppository 650 mg (has no administration in time range)  pantoprazole (PROTONIX) injection 40 mg (has no administration in time range)  sodium chloride (hypertonic) 3 % solution (has no administration in time range)  clevidipine (CLEVIPREX) 0.5 MG/ML infusion (2 mg/hr  Rate/Dose Change 11/19/2020 1334)  etomidate (AMIDATE) injection (20 mg Intravenous Given 11/17/2020 1209)  succinylcholine (ANECTINE) injection (120 mg Intravenous Given 12/12/2020 1209)  labetalol (NORMODYNE) injection 5 mg (5 mg Intravenous Given 11/22/2020 1212)  LORazepam (ATIVAN) injection 2 mg (2 mg Intravenous Given 12/05/2020 1212)  propofol (DIPRIVAN) 1000 MG/100ML infusion (50 mcg/kg/min  New Bag/Given 11/16/2020 1230)  levETIRAcetam (KEPPRA) 3,000 mg in sodium chloride 0.9 % 250 mL IVPB (0 mg Intravenous Stopped 12/08/2020 1244)  mannitol 20 % infusion 80 g (0 g Intravenous Stopped 11/30/2020 1335)  LORazepam (ATIVAN) injection 2 mg (2 mg Intravenous Given 12/02/2020 1232)    ED Course  I have reviewed the triage vital signs and the nursing notes.  Pertinent labs & imaging results that were available during my care of the patient were reviewed by me and considered in my medical decision making (see chart for details).    MDM Rules/Calculators/A&P                          MDM  MSE complete  Stephen Chung was evaluated in Emergency Department on 12/10/2020 for the symptoms described in the history of present illness. He was evaluated in the context of the global COVID-19 pandemic, which necessitated consideration that the patient might be at risk for infection with the SARS-CoV-2 virus that causes COVID-19. Institutional protocols and algorithms that pertain to the evaluation of patients at  risk for COVID-19 are in a state of rapid change based on information released by regulatory bodies including the CDC and federal and state organizations. These policies and algorithms were followed during the patient's care in the ED.   Patient presented with acute onset altered mental status.   CT imaging revealed significant intracranial hemorrhage.    Patient's airway was not protected.  Patient intubated by myself without difficulty.   Neuro team lean initial evaluation and resuscitation.  Neurosurgery team to ED to admit  patient.  Final Clinical Impression(s) / ED Diagnoses Final diagnoses:  Intracranial hemorrhage [facility](HCC)    Rx / DC Orders ED Discharge Orders    None       Wynetta FinesMessick, Dov Dill C, MD 09-May-2021 1416

## 2020-11-13 NOTE — Progress Notes (Signed)
STAT LTM started; pt does have skin abrasions on top of his head; notified [facility] to monitor; Dr Melynda Ripple also notified. Event button tested, nurse educated.

## 2020-11-14 ENCOUNTER — Inpatient Hospital Stay (HOSPITAL_COMMUNITY): Payer: Self-pay

## 2020-11-14 DIAGNOSIS — Z9289 Personal history of other medical treatment: Secondary | ICD-10-CM

## 2020-11-14 DIAGNOSIS — I615 Nontraumatic intracerebral hemorrhage, intraventricular: Principal | ICD-10-CM

## 2020-11-14 DIAGNOSIS — Z0189 Encounter for other specified special examinations: Secondary | ICD-10-CM

## 2020-11-14 DIAGNOSIS — G936 Cerebral edema: Secondary | ICD-10-CM

## 2020-11-14 LAB — CBC WITH DIFFERENTIAL/PLATELET
Abs Immature Granulocytes: 0.1 10*3/uL — ABNORMAL HIGH (ref 0.00–0.07)
Basophils Absolute: 0 10*3/uL (ref 0.0–0.1)
Basophils Relative: 0 %
Eosinophils Absolute: 0 10*3/uL (ref 0.0–0.5)
Eosinophils Relative: 0 %
HCT: 26 % — ABNORMAL LOW (ref 39.0–52.0)
Hemoglobin: 7.4 g/dL — ABNORMAL LOW (ref 13.0–17.0)
Immature Granulocytes: 1 %
Lymphocytes Relative: 4 %
Lymphs Abs: 0.4 10*3/uL — ABNORMAL LOW (ref 0.7–4.0)
MCH: 20.7 pg — ABNORMAL LOW (ref 26.0–34.0)
MCHC: 28.5 g/dL — ABNORMAL LOW (ref 30.0–36.0)
MCV: 72.8 fL — ABNORMAL LOW (ref 80.0–100.0)
Monocytes Absolute: 0.5 10*3/uL (ref 0.1–1.0)
Monocytes Relative: 4 %
Neutro Abs: 11.1 10*3/uL — ABNORMAL HIGH (ref 1.7–7.7)
Neutrophils Relative %: 91 %
Platelets: 128 10*3/uL — ABNORMAL LOW (ref 150–400)
RBC: 3.57 MIL/uL — ABNORMAL LOW (ref 4.22–5.81)
RDW: 22 % — ABNORMAL HIGH (ref 11.5–15.5)
WBC: 12.1 10*3/uL — ABNORMAL HIGH (ref 4.0–10.5)
nRBC: 0 % (ref 0.0–0.2)

## 2020-11-14 LAB — COMPREHENSIVE METABOLIC PANEL
ALT: 40 U/L (ref 0–44)
ALT: 28 U/L (ref 0–44)
AST: 81 U/L — ABNORMAL HIGH (ref 15–41)
AST: 50 U/L — ABNORMAL HIGH (ref 15–41)
Albumin: 4.1 g/dL (ref 3.5–5.0)
Albumin: 3.2 g/dL — ABNORMAL LOW (ref 3.5–5.0)
Alkaline Phosphatase: 95 U/L (ref 38–126)
Alkaline Phosphatase: 58 U/L (ref 38–126)
Anion gap: 20 — ABNORMAL HIGH (ref 5–15)
Anion gap: 8 (ref 5–15)
BUN: 12 mg/dL (ref 6–20)
BUN: 13 mg/dL (ref 6–20)
CO2: 19 mmol/L — ABNORMAL LOW (ref 22–32)
CO2: 23 mmol/L (ref 22–32)
Calcium: 9.1 mg/dL (ref 8.9–10.3)
Calcium: 8.3 mg/dL — ABNORMAL LOW (ref 8.9–10.3)
Chloride: 97 mmol/L — ABNORMAL LOW (ref 98–111)
Chloride: 117 mmol/L — ABNORMAL HIGH (ref 98–111)
Creatinine, Ser: 1.27 mg/dL — ABNORMAL HIGH (ref 0.61–1.24)
Creatinine, Ser: 0.86 mg/dL (ref 0.61–1.24)
GFR, Estimated: 38 mL/min — ABNORMAL LOW (ref >60)
GFR, Estimated: >60 mL/min (ref >60)
Glucose, Bld: 214 mg/dL — ABNORMAL HIGH (ref 70–99)
Glucose, Bld: 183 mg/dL — ABNORMAL HIGH (ref 70–99)
Potassium: 3.5 mmol/L (ref 3.5–5.1)
Potassium: 3.4 mmol/L — ABNORMAL LOW (ref 3.5–5.1)
Sodium: 136 mmol/L (ref 135–145)
Sodium: 148 mmol/L — ABNORMAL HIGH (ref 135–145)
Total Bilirubin: 0.8 mg/dL (ref 0.3–1.2)
Total Bilirubin: 0.3 mg/dL (ref 0.3–1.2)
Total Protein: 9.2 g/dL — ABNORMAL HIGH (ref 6.5–8.1)
Total Protein: 7.4 g/dL (ref 6.5–8.1)

## 2020-11-14 LAB — ECHOCARDIOGRAM COMPLETE
Area-P 1/2: 6.32 cm2
Calc EF: 75.4 %
Height: 64 in
S' Lateral: 2 cm
Single Plane A2C EF: 76.6 %
Single Plane A4C EF: 71.9 %
Weight: 2529.12 oz

## 2020-11-14 LAB — POCT I-STAT, CHEM 8
BUN: 11 mg/dL (ref 6–20)
Calcium, Ion: 1.06 mmol/L — ABNORMAL LOW (ref 1.15–1.40)
Chloride: 99 mmol/L (ref 98–111)
Creatinine, Ser: 1 mg/dL (ref 0.61–1.24)
Glucose, Bld: 225 mg/dL — ABNORMAL HIGH (ref 70–99)
HCT: 34 % — ABNORMAL LOW (ref 39.0–52.0)
Hemoglobin: 11.6 g/dL — ABNORMAL LOW (ref 13.0–17.0)
Potassium: 3.5 mmol/L (ref 3.5–5.1)
Sodium: 136 mmol/L (ref 135–145)
TCO2: 20 mmol/L — ABNORMAL LOW (ref 22–32)

## 2020-11-14 LAB — PHENYTOIN LEVEL, TOTAL: Phenytoin Lvl: 20.5 ug/mL — ABNORMAL HIGH (ref 10.0–20.0)

## 2020-11-14 LAB — SODIUM
Sodium: 146 mmol/L — ABNORMAL HIGH (ref 135–145)
Sodium: 147 mmol/L — ABNORMAL HIGH (ref 135–145)
Sodium: 151 mmol/L — ABNORMAL HIGH (ref 135–145)

## 2020-11-14 LAB — TRIGLYCERIDES: Triglycerides: 82 mg/dL (ref <150)

## 2020-11-14 MED ORDER — SODIUM CHLORIDE 0.9 % IV SOLN
20.0000 mg/kg | Freq: Once | INTRAVENOUS | Status: AC
  Administered 2020-11-14: 1434 mg via INTRAVENOUS
  Filled 2020-11-14: qty 28.68

## 2020-11-14 MED ORDER — SODIUM CHLORIDE 0.9 % IV SOLN
100.0000 mg | Freq: Three times a day (TID) | INTRAVENOUS | Status: DC
  Filled 2020-11-14: qty 2

## 2020-11-14 MED ORDER — PHENYTOIN SODIUM 50 MG/ML IJ SOLN
100.0000 mg | Freq: Three times a day (TID) | INTRAMUSCULAR | Status: DC
  Administered 2020-11-14 – 2020-11-15 (×2): 100 mg via INTRAVENOUS
  Filled 2020-11-14 (×2): qty 2

## 2020-11-14 NOTE — Procedures (Signed)
Patient Name: Stephen Chung  MRN: [id number]  Epilepsy Attending: Charlsie Quest  Referring Physician/Provider: Dr Bing Neighbors Duration: Dec 05, 2020 2127 to 11/14/2020 2127  Patient history: unknown age male with L sided weakness and aphasia. EEG to evaluate for seizure  Level of alertness: comatose/sedated  AEDs during EEG study: LEV, PHT, propofol, versed  Technical aspects: This EEG study was done with scalp electrodes positioned according to the 10-20 International system of electrode placement. Electrical activity was acquired at a sampling rate of 500Hz  and reviewed with a high frequency filter of 70Hz  and a low frequency filter of 1Hz . EEG data were recorded continuously and digitally stored.   Description: EEG showed continuous generalized and lateralized right hemisphere 3 to 6 Hz theta-delta slowing with overriding 15 to 18 Hz beta activity in frontocentral region. Seizures without clinical signs also noted arising from right frontotemporal region, 5-7 seizures an hour, lasting about 1 minute each, last seizure at 1043.  Lateralized periodic discharges were noted in right frontotemporal region at 0.5 Hz. Hyperventilation and photic stimulation were not performed.     ABNORMALITY -Seizure without clinical signs, right frontotemporal region -Lateralized periodic discharges, right frontotemporal region -Continuous slow, generalized and lateralized right hemisphere  IMPRESSION: This study showed seizures without clinical signs arising from right frontotemporal region, average 5-7 seizures per hour, lasting about 1 minute each.  Last seizure was noted at 1043 on 11/14/2020.  Additionally there is evidence of epileptogenicity and cortical dysfunction in right hemisphere likely secondary to underlying structural abnormality/bleed. There is also severe diffuse encephalopathy, nonspecific etiology but likely related to sedation.  Stephen Chung 

## 2020-11-14 NOTE — Progress Notes (Addendum)
STROKETEAMPROGRESSNOTE   INTERVALHISTORY Briefly,unknown identity and history, arrived by EMS with left sided weakness and aphasia found to have right BGH IPH, IVH with mild edema Status epilepticus in CT scanner during Code Stroke evaluation. Intubated for airway protection.  Today patient remains intubated and vented on sedation with propofol and versed.  He is minimally responsive with withdrawal x 4 extremities. There is ongoing concern for seizure for which he will be loaded with Dilantin this morning.   Attempted reach family member, Terrace Arabia,  (?nephew) but our conversation was cut off suddenly. He did say he was trying to get a ride to the hospital and that patient's children live in [facility] and they were trying to get in touch with them. Language barrier was an issue.   Vitals:   11/14/20 0745 11/14/20 0800 11/14/20 0809 11/14/20 0815  BP: (!) 144/73 (!) 143/72  (!) 145/73  Pulse: (!) 104 (!) 106 (!) 106   Resp: (!) 30 (!) 30 (!) 23 (!) 26  Temp:      TempSrc:      SpO2: 100% 100% 100%   Weight:      Height:       CBC: Recent Labs  Lab 2020-12-12 1148 12-12-20 1152 12/12/2020 1427  WBC 13.7*  --   --   NEUTROABS 12.7*  --   --   HGB 9.4* 11.6* 9.9*  HCT 31.6* 34.0* 29.0*  MCV 70.5*  --   --   PLT 161  --   --    BasicMetabolicPanel: Recent Labs  Lab 12/12/2020 1148 12/12/2020 1152 Dec 12, 2020 1427 12/12/2020 1836 11/14/20 0510 11/14/20 0610  NA 136 136 133*   < > 146* 147*  K 3.5 3.5 3.9  --   --   --   CL 97* 99  --   --   --   --   CO2 19*  --   --   --   --   --   GLUCOSE 214* 225*  --   --   --   --   BUN 12 11  --   --   --   --   CREATININE 1.27* 1.00  --   --   --   --   CALCIUM 9.1  --   --   --   --   --    < > = values in this interval not displayed.   LipidPanel: Recent Labs  Lab 11/14/20 0510  TRIG 82    UrineDrugScreen: Recent Labs  Lab 12-12-2020 1721  LABOPIA NONE DETECTED  COCAINSCRNUR NONE DETECTED  LABBENZ POSITIVE*  AMPHETMU NONE  DETECTED  THCU NONE DETECTED  LABBARB NONE DETECTED   AlcoholLevel Recent Labs  Lab 2020/12/12 1148  ETH <10    IMAGING/PERTINENT DIAGNOSTIC STUDIES CT Code Stroke  1. Acute intraparenchymal hemorrhage in the right basal ganglia, dissecting into the right temporal lobe, with intraventricular communication. Mild surrounding edema. Approximate volume 82 cc. Mass effect with right-to-left shift of 9 mm and uncal herniation.  CTA No evidence of large or medium vessel occlusion, significant atherosclerotic vascular disease, aneurysm or vascular malformation.Large right intraparenchymal hemorrhage with intravascular penetration as shown by immediate previous head CT.  Repeat CT head 5/1 No significant interval change in size and morphology of large intraparenchymal hemorrhage centered at the right basal ganglia with intraventricular extension. Surrounding vasogenic edema withregional mass effect and up to 9 mm of right-to-left shift,relatively stable. Slightly increased asymmetric dilatation of the left  lateral ventricle as compared to previous exam. No transependymal flow of CSF. No other new acute intracranial abnormality   Physical Exam Gen: Critically ill intubated middle aged male resting with eyes closed, calm and comfortable apperance CV: RRR. tachy Resp: Intubated, resp even and unlabored Abd: round, soft NTTP Ext: no  edema,  Neuro: MS: Sedated on propofol and versed. Does not respond to voice or gentle tactile stimulation. PERRL 59mm, weak corneals present bilaterally, +gag, + doll's eyes, minimal withdrawal x 4 extremities to noxious stimuli.   Assessment Middle aged appearing male with unknown identity and medical history. His old  chart indicates history of ETOH abuse, back pain, pancytopenia, marked hepatic steatosis,  ETOH related hyponatremia, who was brought in by EMS as Code stroke for dense aphasia, right gaze and left neglect found to have BG IPH and  IVH, early uncal herniation with status epilepticus in CT scanner, s/p intubation for airway protection.   Large right basal ganglionic/thamalic hemorrhage with intraventricular extension,and  associated uncal herniation with cerebral edema and 43mm midline shift. Suspect hypertensive etiology.    CTA: NO LVO  2DEcho pending  Repeat HCT pending today at 1600  Neurosurgery, Dr. Conchita Paris, consulted with no intervention recommended. Advised this is a devastating hemorrhage and likely fatal.   On Hydrowatch  VTEprophylaxis-SCDs  No anticoagulants or antiplatelets  AC/AP prior to admission: Home medications/history unknown.   Stroke deficits TBD, likely substantial, poor prognosis  Therapyrecommendations: TBD pending stability   Disposition: TBD  Cerebral Edema  Neurosurgery: no intervention recommended  Repeat HCT today at 1600 is pending  On HTS @75ccc /hr  Na 133->137->146->147  Seizure  Clinical status epilepticus during code stroke in ED  Given ativan and loaded with Keppra then q 12 hour dosing at 1500mg .   5 seizures per hour still seen on EEG 5/2 am review  Loaded with Dilantin and now on 100mg  TID with dilantin level at 1200 pending  Respiratory Failure  Intubated for airway protection, mechanically ventilated with propofol and versed for sedation   Management per CCM  Hypertension  No home medications on file  On cleviprex gtt   Keep systolic BP less than 140 in the setting of hemorrhage  Fever Mild leukocytosis   Monitor fever curve: 102.1  CXR, KUB, without acute concerns 5/1  May be central  Elevated serum Ctn 1.27 on admit, monitor  Feeding/Nutrition/Dsyphagia  If aggressive plan will need tube placement  High risk for urinary rentention  Bladder scan q 6 hours, I&O prn, replace foley if failed voiding trial x 3 in  6 hour increment   Goals of care  Spoke with nephew, , x2 conversations this morning. He was  able to get ID from patient's apartment and advises the patient's name is:  Derwin Reddy with a date of birth of [birth year].  Terrace Arabia RN, to call for chart merge. We do appear to have old records as nephew advised. Mr. Volney American does not have a ride to hospital thus far today.   I have explained to Mr. 06-22-1961 the urgency of contacting patient's son who reportedly resides in [place].  He agrees to reach out to other people who may have a phone number for his son or a way of getting a message to him. I have provided our call back number.   Possible substance abuse  ETOH abuse history per chart review  Ethanol <10 on arrival, AST 81  + Benzo on UDS   OtherStrokeRiskFactors  AdvancedAge>/=65  Little history available but does show reported ETOH abuse 2019   Hospital day#1  This plan of care was directed by Dr. Roda Shutters.  Shon Hale, NP-C  ATTENDING NOTE: I reviewed above note and agree with the assessment and plan. Pt was seen and examined.   60 year old male with unknown past medical history admitted for aphasia and left-sided weakness.  CT showed large right BG in the temporal lobe ICH with IVH and cerebral edema.  CT head and neck no aneurysm or AVM.  CT repeat showed stable hematoma and IVH and 9 mm midline shift.  He was intubated for airway protection.  UDS positive for benzo.  2D echo pending.  A1c and LDL pending.  He was found to have seizure activity, concerning for status epilepticus.  On Keppra 1500 mg twice daily after Keppra load.  Had long-term EEG overnight showed frequent subclinical seizures, started on fosphenytoin load followed by Dilantin 100 mEq nightly hours.  Dilantin level 20.5 after load.  Check Dilantin level daily.  Tmax 102.1, UA pending, CXR unremarkable, will repeat in a.m.  Blood culture pending.  Hold off antibiotics for now, continue monitoring.  On exam, no family at bedside.  Patient intubated on sedation, eyes closed, not following  commands. With forced eye opening, eyes in mid position, not blinking to visual threat, doll's eyes present, not tracking, PERRL. Corneal reflex weak bilaterally, gag and cough present. Breathing over the vent.  Facial symmetry not able to test due to ET tube.  Tongue protrusion not cooperative. On pain stimulation, slight withdraw in all 4 extremities. DTR 1+ and no babinski. Sensation, coordination and gait not tested.  Patient currently on Versed and propofol, continue Keppra and Dilantin, continue long-term EEG monitoring.  On Cleviprex for BP control, BP goal less than 160.  On 3% saline, sodium goal 150-160.  CT repeat this afternoon showed stable hematoma, midline shift and ventricular size.  May consider tube feeding tomorrow.  We will continue to contact family especially his son for further GOC discussion.    For detailed assessment and plan, please refer to above as I have made changes wherever appropriate.   Marvel Plan, MD PhD Stroke Neurology 11/14/2020 5:34 PM  This patient is critically ill due to large right BG and temporal ICH, cerebral edema, IVH, seizure, respiratory failure and at significant risk of neurological worsening, death form hematoma expansion, obstructive hydrocephalus, brain herniation, status epilepticus, heart failure. This patient's care requires constant monitoring of vital signs, hemodynamics, respiratory and cardiac monitoring, review of multiple databases, neurological assessment, discussion with family, other specialists and medical decision making of high complexity. I spent 45 minutes of neurocritical care time in the care of this patient.   TocontactStrokeContinuityprovider,[email or web address]. Afterhours,contactGeneralNeurology

## 2020-11-14 NOTE — Progress Notes (Signed)
  Echocardiogram 2D Echocardiogram has been performed.  Stephen Chung 11/14/2020, 2:57 PM

## 2020-11-14 NOTE — Progress Notes (Signed)
Assisted in transporting patient to CT.  RN at bedside.  Pt tolerated well.

## 2020-11-14 NOTE — Progress Notes (Signed)
Neurology Progress Note  Brief HPI: Patient brought in by EMS for left hemiparesis and aphasia but unable to follow commands. CTH was obtained revealing an acute IPH in the right basal ganglia dissecting into the right temporal lobe with intraventricular communication and mild surrounding edema (approx. 82 cc hemorrhage). Mass effect with right-to-left shift of 52mm and uncal herniation. Unable to tell PMHx due to unknown person and no family present. During [facility] patient had a focal onset left-sided seizure that then generalized requiring ativan and LEV. Per NSU, injury likely not survivable so no evacuation performed. Patient remains in the ICU on LTM with evidence of ongoing seizure activity.  Subjective: 5/1 overnight: Vimpat 200 mg IV once due to RUE rhythmic twitching, LTM EEG initiated 5/2 ongoing right frontotemporal region seizures (5 - 7 per hour lasting ~ 1 minute each) Loaded with fosphenytoin, phenytoin maintenance.   Exam: Vitals:   11/14/20 1100 11/14/20 1136  BP:    Pulse:  (!) 108  Resp:  18  Temp: 99 F (37.2 C)   SpO2:  100%   Gen: Intubated and sedated in the ICU.  Resp: ETT in place with mechanical ventilation with spontaneous respirations over the set ventilator rate Abd: soft, non-distended  Neuro: MS: Intubated and sedated, does not follow commands. Does not open eyes to any stimuli.  CN: Eyes midline, + oculocephalic reflex, PERRL 3 mm --> 60mm, does not fixate or track examiner, does not blink to threat with passive eye opening, minimal corneal reflexes bilaterally, gag and cough intact, does not protrude tongue to command.  Motor: Right upper and lower extremity withdrawal briskly to noxious stimuli. RUE to midline but does not cross midline. Intermittent increased tone of the right upper extremity. Left upper extremity with intermittent jerking / inward rotation. Left lower extremity withdrawals to noxious stimuli, though less briskly compared to the right. Bulk is  normal, tone is normal on the left. Sensory: Withdrawals to noxious stimuli on bilateral upper and lower extremities to noxious stimuli. Unable to assess sensation to light touch / temperature Plantars: Toes up-going bilaterally DTR: 1+ and symmetric patellae, 2+ left bicep, unable to assess right bicep due to increased tone / stiffening  Gait: Deferred   Pertinent Labs: CBC    Component Value Date/Time   WBC 13.7 (H) 2020/11/28 1148   RBC 4.48 11-28-20 1148   HGB 9.9 (L) 2020/11/28 1427   HCT 29.0 (L) 11/28/20 1427   PLT 161 28-Nov-2020 1148   MCV 70.5 (L) 11-28-2020 1148   MCH 21.0 (L) 11/28/20 1148   MCHC 29.7 (L) Nov 28, 2020 1148   RDW 21.1 (H) 11-28-2020 1148   LYMPHSABS 0.1 (L) 11/28/20 1148   MONOABS 0.8 2020-11-28 1148   EOSABS 0.0 11/28/2020 1148   BASOSABS 0.1 28-Nov-2020 1148  CMP     Component Value Date/Time   NA 147 (H) 11/14/2020 0610   K 3.9 11/28/20 1427   CL 99 2020-11-28 1152   CL 99 Nov 28, 2020 1152   CO2 19 (L) 11/28/20 1148   GLUCOSE 225 (H) 11-28-2020 1152   GLUCOSE 225 (H) Nov 28, 2020 1152   BUN 11 11/28/20 1152   BUN 11 Nov 28, 2020 1152   CREATININE 1.00 11-28-20 1152   CREATININE 1.00 11-28-2020 1152   CALCIUM 9.1 11-28-2020 1148   PROT 9.2 (H) 2020/11/28 1148   ALBUMIN 4.1 Nov 28, 2020 1148   AST 81 (H) 2020/11/28 1148   ALT 40 28-Nov-2020 1148   ALKPHOS 95 11/28/20 1148   BILITOT 0.8 28-Nov-2020 1148  GFRNONAA 38 (L) Dec 07, 2020 1148   Drugs of Abuse     Component Value Date/Time   LABOPIA NONE DETECTED Dec 07, 2020 1721   COCAINSCRNUR NONE DETECTED 2020-12-07 1721   LABBENZ POSITIVE (A) 12/07/20 1721   AMPHETMU NONE DETECTED 07-Dec-2020 1721   THCU NONE DETECTED 12/07/20 1721   LABBARB NONE DETECTED 12/07/2020 1721    Lab Results  Component Value Date   TRIG 82 11/14/2020   Imaging Reviewed:  CT Head Code Stroke: Acute intraparenchymal hemorrhage in the right basal ganglia, dissecting into the right temporal lobe,  with intraventricular communication. Mild surrounding edema. Approximate volume 82 cc. Mass effect with right-to-left shift of 9 mm and uncal herniation.  CTA Head and neck: No evidence of large or medium vessel occlusion, significant atherosclerotic vascular disease, aneurysm or vascular malformation. Large right intraparenchymal hemorrhage with intravascular penetration as shown by immediate previous head CT.  CT Head 5/1: 1. No significant interval change in size and morphology of large intraparenchymal hemorrhage centered at the right basal ganglia with intraventricular extension. Surrounding vasogenic edema with regional mass effect and up to 9 mm of right-to-left shift, relatively stable. 2. Slightly increased asymmetric dilatation of the left latera ventricle as compared to previous exam. No transependymal flow of CSF. 3. No other new acute intracranial abnormality.  Overnight EEG 5/2 preliminary read: 5-7 seizures per hour on average, about 1 minute each in duration, subclinical.   Assessment: 60 year old male with unknown medical history presented for evaluation of left hemiparesis and aphasia. While in CT scanner he was witnessed to have a left focal then generalized seizure. CT revealed large left basal ganglia IPH.  - Examination reveals patient intubated and sedated in the ICU. Patient does not follow commands, will withdrawal to noxious stimuli x4 R more briskly than the L upper and lower extremity. He does have twitching movements of bilateral lower extremities and RUE and intermittent inward rotation of the left arm that are not rhythmic. EEG with evidence of ongoing seizure activity from the right frontotemporal region and severe diffuse encephalopathy without clinical correlation seen on video.  - NSU is following; evacuation thought to not be beneficial to patient as injury is considered devastating - Initial AED therapy consisted of propofol gtt, midazolam gtt, LEV 1,500 mg BID.  Was given Vimpat 200 mg IV overnight x1. Due to ongoing seizures, patient was then loaded with fosphenytoin 20 mg PE/kg with maintenance phenytoin 100 mg q8h  - Difficulty with contacting patient's family. Distant family member able to provide patient's name. He has children that live across the country, family member is attempting contact.   Impression:  - Large right basal ganglia intraparenchymal hemorrhage with mass effect and right-to-left shift - Subclinical refractory seizures; right frontotemporal region - Severe diffuse encephalopathy   Recommendations: - Continue LTM - Continue Keppra 1,500 mg BID, Propofol gtt, Versed gtt, Dilantin 100 mg q8h - Dilantin level from 1200 pending - Inpatient seizure precautions - 2 mg IV Ativan for any seizure lasting > 5 minutes and notify neurology  - Per Stroke team: Goals of care discussion with family (patient's children) once able to be reached  Lanae Boast, AGACNP-BC [facility] [phone]  Electronically signed: Dr. Caryl Pina

## 2020-11-14 NOTE — Progress Notes (Signed)
Pt's daughter in-law, Darien Ramus, called for an update.  Confirmed that her husband, Marijean Niemann, would be the decision maker.  The patient's other son died in 10/24/16 and his 2 daughters are in [place] and not in contact with the patient at all. Son will need an interpreter if/when MD calls but the wife, Darien Ramus, speaks fluent [place]. Kadon Andrus C 3:06 PM

## 2020-11-14 NOTE — Progress Notes (Signed)
LTM maint complete - all leads in place.

## 2020-11-14 NOTE — Progress Notes (Signed)
Patient's son, [name], called to check on his father.  He said his understanding of English was pretty poor so asked if his wife, [name], could call after she gets off of work today to get and update. A password was set up with jaime. Stephan Draughn C 2:23 PM

## 2020-11-14 NOTE — Progress Notes (Signed)
Overnight emergency services was able to contact family of patient.   Brother in [place] [phone]), after I spoke with him he called the patients son.   I spoke with patient's sons wife Helyn App, who stated son was named Terrace Arabia, [phone]). She stated that Danelle Earthly was unable to come to the hospital and make decisions at that time but would come in the morning.

## 2020-11-14 NOTE — Progress Notes (Signed)
NAMEShaune Chung, MRN:  [id number], DOB:  07/16/1873, LOS: 1 ADMISSION DATE:  12/04/2020, CONSULTATION DATE:  12/06/2020 REFERRING MD:  Dr. Selina Cooley, CHIEF COMPLAINT:  Ventilator management    History of Present Illness:  Stephen Chung is a male of unknown age with unknown PMH who presented to the emergency department after being found with left-sided weakness and aphasia. On EMS arrival patient was able to move right UE spontaneously but unable to follow commands. Last known normal time was unknown.   On arrival vital signs significant for hypothermia with all vital signs stable. Labwork significant for glucose 214, creatine 1.27, anion gap 20, AST 81, wbc 13.7, hgb 9.4. Stroke head CT and CTA head and neck with acute intraparenchymal hemorrhage. During imaging patient vomited and began having seizure like activity therefore patient was intubated for airway protection and PCCM was consulted for vent management. NSG was consulted and did not feel as if evacuation was needed as ICH was likely not a survivable.   Pertinent  Medical History  Unknown   Significant Hospital Events: Including procedures, antibiotic start and stop dates in addition to other pertinent events   . 12/12/2020 admitted after being found  Interim History / Subjective:  Patient continued to have what was likely seizure activity overnight. Placed on EEG and antiseizure meds per neuro. Patient lying in bed this AM, sedated. Propofol 60mcg/kg/min. Midazolom 8mg /hr. Does not respond to commands or pain. Appears comfortable.  Objective   Blood pressure (!) 144/72, pulse (!) 107, temperature 99.5 F (37.5 C), temperature source Rectal, resp. rate (!) 26, height 5\' 4"  (1.626 m), weight 71.7 kg, SpO2 100 %.    Vent Mode: PSV;CPAP FiO2 (%):  [30 %-100 %] 30 % Set Rate:  [16 bmp] 16 bmp Vt Set:  [350 mL-470 mL] 350 mL PEEP:  [5 cmH20] 5 cmH20 Plateau Pressure:  [8 cmH20-14 cmH20] 8 cmH20   Intake/Output Summary (Last 24 hours)  at 11/14/2020 0835 Last data filed at 11/14/2020 0800 Gross per 24 hour  Intake 3025.28 ml  Output 950 ml  Net 2075.28 ml   Filed Weights   12/10/2020 1256 11/25/2020 1600  Weight: 73 kg 71.7 kg    Examination: General: chronically ill-appearing. Lying in bed. No acute distress. On mechanical ventilation. HEENT: Pupils equal and responsive to light. Neuro: Unable to follow commands. Does not arouse to voice. Does not withdraw to pain. Left foot jerking and right hand movement noted briefly. CV: Regular rate and rhythm. Systolic murmur present at upper sternal border. PULM: Lungs CTAB. No crackles or wheezing. No increased work of breathing. GI: Abd soft and nontender. Extremities: Warm. 2+ pulses. Skin: No visible lesions or rashes. Warm and dry.  Labs/imaging that I havepersonally reviewed    Head CTA 11/24/2020 > No evidence of large or medium vessel occlusion, significant atherosclerotic vascular disease, aneurysm or vascular malformation. Large right intraparenchymal hemorrhage with intravascular penetration as shown by immediate previous head CT.  Resolved Hospital Problem list     Assessment & Plan:  Large acute basal ganglia  ICU with midline shift  -NSU evaluated and did not feel evacuation was indicated given likely nonsurvival bleed  P: - Management per neurology: continuous EEG; BP mgmt with Cleviprex (goal SBP < 160); Seizure control with keppra, versed, vimpat and propofol;  - Maintain neuro protective measures; goal for eurothermia, euglycemia, eunatermia, normoxia, and PCO2 goal of 35-40 - Nutrition and bowel regiment  - Aspirations precautions  - Son contacted last night (5/1) -  GOC conversation today with primary  - No acute need or indication for central line placement at this time   Acute Hypoxic Respiratory Failure  -In the setting of above  P: - RT put patient on PS wean this AM (5/2). Will monitor.  - Continue ventilator support with lung protective strategies   - Wean PEEP and FiO2 for sats greater than 90%. - Head of bed elevated 30 degrees. - Plateau pressures less than 30 cm H20.  - Follow intermittent chest x-ray and ABG.   - Ensure adequate pulmonary hygiene  - VAP bundle in place  - PAD protocol  Rest of care managed per primary   Best practice   Diet:  NPO Pain/Anxiety/Delirium protocol (if indicated): Yes (RASS goal -1) VAP protocol (if indicated): Yes DVT prophylaxis: Contraindicated GI prophylaxis: PPI Glucose control:  SSI Yes Central venous access:  N/A Arterial line:  N/A Foley:  Yes, and it is still needed Mobility:  bed rest  PT consulted: Yes Last date of multidisciplinary goals of care discussion Pending  Code Status:  full code Disposition: ICU  Labs   CBC: Recent Labs  Lab 11-20-20 1148 11-20-20 1152 11-20-2020 1427  WBC 13.7*  --   --   NEUTROABS 12.7*  --   --   HGB 9.4* 11.6* 9.9*  HCT 31.6* 34.0* 29.0*  MCV 70.5*  --   --   PLT 161  --   --     Basic Metabolic Panel: Recent Labs  Lab 11/20/20 1148 20-Nov-2020 1152 11-20-2020 1427 11/20/2020 1836 11/14/20 0510 11/14/20 0610  NA 136 136 133* 137 146* 147*  K 3.5 3.5 3.9  --   --   --   CL 97* 99  --   --   --   --   CO2 19*  --   --   --   --   --   GLUCOSE 214* 225*  --   --   --   --   BUN 12 11  --   --   --   --   CREATININE 1.27* 1.00  --   --   --   --   CALCIUM 9.1  --   --   --   --   --    GFR: Estimated Creatinine Clearance: -4.5 mL/min (by C-G formula based on SCr of 1 mg/dL). Recent Labs  Lab Nov 20, 2020 1148  WBC 13.7*    Liver Function Tests: Recent Labs  Lab Nov 20, 2020 1148  AST 81*  ALT 40  ALKPHOS 95  BILITOT 0.8  PROT 9.2*  ALBUMIN 4.1   No results for input(s): LIPASE, AMYLASE in the last 168 hours. No results for input(s): AMMONIA in the last 168 hours.  ABG    Component Value Date/Time   PHART 7.515 (H) 20-Nov-2020 1427   PCO2ART 40.8 2020/11/20 1427   PO2ART 562 (H) Nov 20, 2020 1427   HCO3 33.3 (H) 11-20-20  1427   TCO2 35 (H) Nov 20, 2020 1427   O2SAT 100.0 11-20-20 1427     Coagulation Profile: Recent Labs  Lab 11/20/2020 1148  INR 1.1    Cardiac Enzymes: No results for input(s): CKTOTAL, CKMB, CKMBINDEX, TROPONINI in the last 168 hours.  HbA1C: No results found for: HGBA1C  CBG: No results for input(s): GLUCAP in the last 168 hours.  Review of Systems:   Unable to gather given acute critical illness   Past Medical History:  He,  has no past medical history on  file.   Surgical History:     Social History:      Family History:  His family history is not on file.   Allergies Not on File   Home Medications  Prior to Admission medications   Not on File     Sallee Provencal, [place]

## 2020-11-14 NOTE — Progress Notes (Signed)
  NEUROSURGERY PROGRESS NOTE   No issues overnight. Likely continued SZ, LTEEG initiated yesterday.  EXAM:  BP (!) 145/70   Pulse (!) 108   Temp 99 F (37.2 C) (Rectal)   Resp 18   Ht 5\' 4"  (1.626 m)   Wt 71.7 kg   SpO2 100%   BMI 27.13 kg/m   Intubated Pupils 50mm, reactive OU No movement LUE/LLE Occasional non-purposeful spontaneous movement RUE/RLE  IMPRESSION:  60 y.o. male with very large right ganglionic/thalamic/temporal/ventricular hemorrhage and associated SZ, possible status.   No significant worsening of ventriculomegaly on most recent CT to warrant placement of EVD at this time.  Given the size of this hemorrhage and the associated refractory SZ, I suspect this will be a devastating injury. While it remains to be seen whether he can survive this hemorrhage, the likelihood that he will have a meaningful recovery to independence seems negligible.  PLAN: - Would cont current supportive care - If family available, would initiate discussion regarding prognosis and goal of care   60, MD [facility]

## 2020-11-15 ENCOUNTER — Inpatient Hospital Stay (HOSPITAL_COMMUNITY): Payer: Self-pay

## 2020-11-15 DIAGNOSIS — R509 Fever, unspecified: Secondary | ICD-10-CM

## 2020-11-15 DIAGNOSIS — J9602 Acute respiratory failure with hypercapnia: Secondary | ICD-10-CM

## 2020-11-15 DIAGNOSIS — Z9289 Personal history of other medical treatment: Secondary | ICD-10-CM

## 2020-11-15 DIAGNOSIS — Z0189 Encounter for other specified special examinations: Secondary | ICD-10-CM

## 2020-11-15 DIAGNOSIS — J189 Pneumonia, unspecified organism: Secondary | ICD-10-CM

## 2020-11-15 LAB — URINALYSIS, ROUTINE W REFLEX MICROSCOPIC
Bilirubin Urine: NEGATIVE
Glucose, UA: NEGATIVE mg/dL
Hgb urine dipstick: NEGATIVE
Ketones, ur: NEGATIVE mg/dL
Leukocytes,Ua: NEGATIVE
Nitrite: NEGATIVE
Protein, ur: NEGATIVE mg/dL
Specific Gravity, Urine: 1.016 (ref 1.005–1.030)
pH: 5 (ref 5.0–8.0)

## 2020-11-15 LAB — CBC
HCT: 27.9 % — ABNORMAL LOW (ref 39.0–52.0)
Hemoglobin: 7.9 g/dL — ABNORMAL LOW (ref 13.0–17.0)
MCH: 20.6 pg — ABNORMAL LOW (ref 26.0–34.0)
MCHC: 28.3 g/dL — ABNORMAL LOW (ref 30.0–36.0)
MCV: 72.8 fL — ABNORMAL LOW (ref 80.0–100.0)
Platelets: 134 10*3/uL — ABNORMAL LOW (ref 150–400)
RBC: 3.83 MIL/uL — ABNORMAL LOW (ref 4.22–5.81)
RDW: 22.5 % — ABNORMAL HIGH (ref 11.5–15.5)
WBC: 11.9 10*3/uL — ABNORMAL HIGH (ref 4.0–10.5)
nRBC: 0 % (ref 0.0–0.2)

## 2020-11-15 LAB — BASIC METABOLIC PANEL
Anion gap: 9 (ref 5–15)
BUN: 7 mg/dL (ref 6–20)
CO2: 25 mmol/L (ref 22–32)
Calcium: 8.8 mg/dL — ABNORMAL LOW (ref 8.9–10.3)
Chloride: 119 mmol/L — ABNORMAL HIGH (ref 98–111)
Creatinine, Ser: 0.72 mg/dL (ref 0.61–1.24)
GFR, Estimated: >60 mL/min (ref >60)
Glucose, Bld: 143 mg/dL — ABNORMAL HIGH (ref 70–99)
Potassium: 2.8 mmol/L — ABNORMAL LOW (ref 3.5–5.1)
Sodium: 153 mmol/L — ABNORMAL HIGH (ref 135–145)

## 2020-11-15 LAB — POCT I-STAT 7, (LYTES, BLD GAS, ICA,H+H)
Acid-Base Excess: 2 mmol/L (ref 0.0–2.0)
Bicarbonate: 25.9 mmol/L (ref 20.0–28.0)
Calcium, Ion: 1.18 mmol/L (ref 1.15–1.40)
HCT: 27 % — ABNORMAL LOW (ref 39.0–52.0)
Hemoglobin: 9.2 g/dL — ABNORMAL LOW (ref 13.0–17.0)
O2 Saturation: 99 %
Patient temperature: 36.7
Potassium: 3.7 mmol/L (ref 3.5–5.1)
Sodium: 147 mmol/L — ABNORMAL HIGH (ref 135–145)
TCO2: 27 mmol/L (ref 22–32)
pCO2 arterial: 34.6 mmHg (ref 32.0–48.0)
pH, Arterial: 7.48 — ABNORMAL HIGH (ref 7.350–7.450)
pO2, Arterial: 106 mmHg (ref 83.0–108.0)

## 2020-11-15 LAB — LIPID PANEL
Cholesterol: 223 mg/dL — ABNORMAL HIGH (ref 0–200)
HDL: 116 mg/dL (ref >40)
LDL Cholesterol: 90 mg/dL (ref 0–99)
Total CHOL/HDL Ratio: 1.9 RATIO
Triglycerides: 85 mg/dL (ref <150)
VLDL: 17 mg/dL (ref 0–40)

## 2020-11-15 LAB — HEMOGLOBIN A1C
Hgb A1c MFr Bld: 6.1 % — ABNORMAL HIGH (ref 4.8–5.6)
Mean Plasma Glucose: 128.37 mg/dL

## 2020-11-15 LAB — SODIUM
Sodium: 152 mmol/L — ABNORMAL HIGH (ref 135–145)
Sodium: 154 mmol/L — ABNORMAL HIGH (ref 135–145)
Sodium: 156 mmol/L — ABNORMAL HIGH (ref 135–145)

## 2020-11-15 LAB — PHENYTOIN LEVEL, TOTAL: Phenytoin Lvl: 15.1 ug/mL (ref 10.0–20.0)

## 2020-11-15 LAB — MAGNESIUM: Magnesium: 1.3 mg/dL — ABNORMAL LOW (ref 1.7–2.4)

## 2020-11-15 MED ORDER — SODIUM CHLORIDE 0.9 % IV SOLN
150.0000 mg | Freq: Three times a day (TID) | INTRAVENOUS | Status: DC
  Administered 2020-11-15 – 2020-11-16 (×3): 150 mg via INTRAVENOUS
  Filled 2020-11-15 (×7): qty 3

## 2020-11-15 MED ORDER — POTASSIUM CHLORIDE 10 MEQ/100ML IV SOLN
10.0000 meq | INTRAVENOUS | Status: AC
  Administered 2020-11-15 (×5): 10 meq via INTRAVENOUS
  Filled 2020-11-15 (×5): qty 100

## 2020-11-15 MED ORDER — SODIUM CHLORIDE 0.9 % IV SOLN
400.0000 mg | Freq: Once | INTRAVENOUS | Status: AC
  Administered 2020-11-15: 400 mg via INTRAVENOUS
  Filled 2020-11-15: qty 8

## 2020-11-15 MED ORDER — SODIUM CHLORIDE 0.9 % IV SOLN
2.0000 g | INTRAVENOUS | Status: DC
  Administered 2020-11-15 – 2020-11-16 (×2): 2 g via INTRAVENOUS
  Filled 2020-11-15 (×2): qty 2

## 2020-11-15 NOTE — Progress Notes (Signed)
LTM maint complete - a1 SERVICED [facility] monitored, Event button test confirmed by [facility].

## 2020-11-15 NOTE — Progress Notes (Signed)
Spoke with Elink re: video visit with daughter in [place]. Unfortunately, a video visit is not possible through [employer]

## 2020-11-15 NOTE — Plan of Care (Signed)
Gracilla, interpretor, and I spoke with son, Alfredo Batty (resides in [place])  twice today. The second time daughter, Lanora Manis, who resides in [place] was also included at [employer] request. With Gracilla's assistance I explained patient's unfortunate current status, diagnoses, and treatment plan. We discussed critically ill status and possibility of further decline at any time. Poor prognosis and quality of life issues (severe stroke deficits expected) were addressed as well as possibility of non-survivable nature of his condition as indicated by neurosurgery. We explored the possibility of comfort care moving forward as a treatment option as well as aggressive measures. Code status was addressed as well. Their questions were answered. They both agreed to DNR code status. We agreed to speak again tomorrow at 1100 to discuss plan of care further. Daughter in law, Darien Ramus, is tentatively planning to come to bedside from [place].   Elizabeth requested video visit with father if possible. Her cell phone number is: [phone]. Ottis Stain, RN, has kindly agreed to follow up with Elink on this request.   This was plan of care was discussed with Dr. Everardo Pacific, NP-C

## 2020-11-15 NOTE — Progress Notes (Addendum)
STROKETEAMPROGRESSNOTE   INTERVALHISTORY Briefly,unknown identity and history, arrived by EMS with left sided weakness and aphasia found to have right BGH IPH, IVH with mild edema Status epilepticus in CT scanner during Code Stroke evaluation. Intubated for airway protection.  Today patient remains intubated and vented on sedation with propofol. He remains with only withdrawal response to noxious stimuli on exam. No visible seizure activity overnight per nursing.  RLL pneumonia is suspected. CCM considering starting antibiotic therapy. Remains on HTS with Na 153 at goal 150-160. Still requiring cleviprex drip for BP management. Attempting to coordinate interpretor assisted family discussion with son in [place].   Vitals:   11/15/20 0515 11/15/20 0530 11/15/20 0545 11/15/20 0600  BP: (!) 161/72 (!) 156/70 (!) 159/67 (!) 160/68  Pulse: (!) 110 (!) 110 (!) 111 (!) 112  Resp: (!) 25 (!) 25 (!) 26 (!) 28  Temp:      TempSrc:      SpO2: 100% 100% 100% 100%  Weight:      Height:       CBC: Recent Labs  Lab 2020/11/19 1148 11-19-20 1152 11/14/20 1237 11/15/20 0555  WBC 13.7*  --  12.1* 11.9*  NEUTROABS 12.7*  --  11.1*  --   HGB 9.4*   < > 7.4* 7.9*  HCT 31.6*   < > 26.0* 27.9*  MCV 70.5*  --  72.8* 72.8*  PLT 161  --  128* 134*   < > = values in this interval not displayed.   BasicMetabolicPanel: Recent Labs  Lab 11/14/20 1237 11/14/20 1822 11/15/20 0041 11/15/20 0555  NA 148*   < > 152* 153*  K 3.4*  --   --  2.8*  CL 117*  --   --  119*  CO2 23  --   --  25  GLUCOSE 183*  --   --  143*  BUN 13  --   --  7  CREATININE 0.86  --   --  0.72  CALCIUM 8.3*  --   --  8.8*   < > = values in this interval not displayed.   LipidPanel: Recent Labs  Lab 11/15/20 0041  CHOL 223*  TRIG 85  HDL 116  CHOLHDL 1.9  VLDL 17  LDLCALC 90    UrineDrugScreen: Recent Labs  Lab Nov 19, 2020 1721  LABOPIA NONE DETECTED  COCAINSCRNUR NONE DETECTED  LABBENZ POSITIVE*  AMPHETMU NONE  DETECTED  THCU NONE DETECTED  LABBARB NONE DETECTED   AlcoholLevel Recent Labs  Lab 2020-11-19 1148  ETH <10    IMAGING/PERTINENT DIAGNOSTIC STUDIES CT Code Stroke  1. Acute intraparenchymal hemorrhage in the right basal ganglia, dissecting into the right temporal lobe, with intraventricular communication. Mild surrounding edema. Approximate volume 82 cc. Mass effect with right-to-left shift of 9 mm and uncal herniation.  CTA No evidence of large or medium vessel occlusion, significant atherosclerotic vascular disease, aneurysm or vascular malformation.Large right intraparenchymal hemorrhage with intravascular penetration as shown by immediate previous head CT.  Repeat CT head 5/1 No significant interval change in size and morphology of large intraparenchymal hemorrhage centered at the right basal ganglia with intraventricular extension. Surrounding vasogenic edema withregional mass effect and up to 9 mm of right-to-left shift,relatively stable. Slightly increased asymmetric dilatation of the left lateral ventricle as compared to previous exam. No transependymal flow of CSF. No other new acute intracranial abnormality  Physical Exam Gen: Critically ill intubated middle aged male resting with eyes closed, calm and comfortable apperance CV: RRR. tachy Resp:  Intubated, resp even and unlabored Abd: round, soft NTTP Ext: no  edema,  Neuro: MS: Remains sedated on propofol which was paused for exam. Does not respond to voice or clap. PERRL 20mm, weak corneals present bilaterally, +gag, + doll's eyes, minimal withdrawal x 4 extremities to noxious stimuli.   Assessment 60 yo male who presented with identity and medical history unknown. History obtained from old [facility] chart indicates history of ETOH abuse, back pain, pancytopenia, marked hepatic steatosis,  ETOH related hyponatremia, who was brought in by EMS as Code stroke for dense aphasia, right gaze and left neglect found to have  BG IPH and IVH, early uncal herniation with status epilepticus in CT scanner, s/p intubation for airway protection.   Large right basal ganglionic/thamalic hemorrhage with intraventricular extension,and  associated uncal herniation with cerebral edema and 77mm midline shift. Suspect hypertensive etiology.    CTA: NO LVO  2DEcho:   Repeat HCT stable  Plan fo repeat CT tomorrow am  Neurosurgery, Dr. Conchita Paris, consulted with no intervention recommended. Advised this is a devastating hemorrhage and likely fatal.   On Hydro watch, low threshold for repeat HCT  VTEprophylaxis-SCDs  No anticoagulants or antiplatelets  AC/AP prior to admission: Home medications/history unknown.   Stroke deficits TBD, likely substantial, poor prognosis  Therapyrecommendations: TBD pending stability   Disposition: TBD  Cerebral Edema  Neurosurgery: no intervention recommended  Repeat HCT 5/2: showed stable hematoma, midline shift and ventricular size.    On HTS @75ccc /hr  Sodium goal 150-160.   Na 133->137->146->147->153  Seizure  Clinical status epilepticus during code stroke in ED  Given ativan and loaded with Keppra then q 12 hour dosing at 1500mg .   5 seizures per hour still seen on EEG 5/2 am review  Loaded with Dilantin and now on 100mg  TID with   Dilantin level 20.5->15.1  Will give dilantin load 400mg  today and increase dilantin dose as above   LTM continued   Respiratory Failure  Intubated for airway protection, mechanically ventilated with propofol and versed for sedation   Management per CCM  Hypertension  No home medications on file  On cleviprex gtt   Keep systolic BP less than 140 in the setting of hemorrhage  Fever Mild leukocytosis  ?RLL Pneumonia   Monitor fever curve: 102->100.8  CXR, KUB, without acute concerns 5/1  CXR repeat 5/3: Right base atelectasis and infiltrate. Findings suggest right base pneumonia.CCM considering antibiotics.    Blood cultures are pending  UA, urine culture pending  Acute blood loss anemia  9.0->7.4->7.9  Monitor   Elevated serum Ctn 1.27->0.72 Monitor  Feeding/Nutrition/Dsyphagia  If aggressive plan will need tube placement  May consider tube feeding today. We will continue to contact family especially his son for further GOC discussion.    High risk for urinary rentention  Bladder scan q 6 hours, I&O prn, replace foley if failed voiding trial x 3 in  6 hour increment  Goals of care  Updated daughter in law, , by phone as she speaks fluent  and she had to be at work soon. , interpretor, assisted with phone call to son, June. I updated him on patient's critical status, poor prognosis and complex medical scenario. We discussed treatment alternatives and explored comfort care options. He would like to consider and speak to his sibling in [place].  His questions were answered. He requested that Gracilla assist with our team speaking to his sister in [place]. He will call Gracilla back to coordinate this.  Possible substance abuse  ETOH abuse history per chart review  Ethanol <10 on arrival, AST 81  + Benzo on UDS   Hypokalemia  Monitor and replete prn   OtherStrokeRiskFactors  AdvancedAge>/=65  Little history available but does show reported ETOH abuse 2019  Hospital day#2  This plan of care was directed by Dr. Everardo Pacific, NP-C, MSN  ATTENDING NOTE: I reviewed above note and agree with the assessment and plan. Pt was seen and examined.   Patient still intubated, overnight continued to have fever, T-max 101.4.  Sodium 153, continue on 3% saline.  On long-term EEG, no more seizures since yesterday 11 AM.  Dilantin level 15 this morning, will give Dilantin load and increase maintenance dose of Dilantin.  CT repeat this morning showed stable hematoma and midline shift.  CXR showed right base pneumonia.  On exam, intubated on sedation,  eyes closed, following commands. With forced eye opening, eyes in position, not blinking to visual threat, doll's eyes present, not tracking, PERRL. Corneal reflex weakly present bilaterally, gag and cough present. Breathing over the vent.  Facial symmetry not able to test due to ET tube.  Tongue protrusion not cooperative. On pain stimulation, LUE mild extension. LLE mild withdraw. LUE able to against gravity with pain, but LLE withdraw stronger than left LE but not against gravity. DTR 1+ and no babinski. Sensation, coordination and gait not tested.  Continue 3% saline, sodium goal 150-160.  Consider 23.4% saline if needed.  Dilantin 400 load and increase maintenance Dilantin 250 to 8 hours.  Continue Keppra.  Continue long-term EEG for 1 more night.  Discussed with CCM Dr. Kendrick Fries, will put on rocephin for right base pneumonia.  UA negative, blood culture pending.  Patient son, daughter and daughter-in-law are contacted by our NP through interpreter, CODE STATUS was addressed, they agreed for DNR at this time.  Further communication will be set up for further GOC discussion.  This patient is critically ill due to large right temporal and BG hemorrhage, IVH, cerebral edema, fever, pneumonia, status epilepticus and at significant risk of neurological worsening, death form brain herniation, status epilepticus, septic shock. This patient's care requires constant monitoring of vital signs, hemodynamics, respiratory and cardiac monitoring, review of multiple databases, neurological assessment, discussion with family, other specialists and medical decision making of high complexity. I spent 40 minutes of neurocritical care time in the care of this patient.  I discussed with Dr. Kendrick Fries from CCM.  Marvel Plan, MD PhD Stroke Neurology 11/15/2020 8:16 PM      To contact Stroke Continuity provider,please refer to [email or web address]. After hours, contact General Neurology

## 2020-11-15 NOTE — Procedures (Signed)
Patient Name: Stephen Chung  MRN: [id number]  Epilepsy Attending: Charlsie Quest  Referring Physician/Provider: Dr Bing Neighbors Duration: 11/14/2020 2127 to 11/15/2020 2127  Patient history: unknown age male with L sided weakness and aphasia. EEG to evaluate for seizure  Level of alertness: comatose/sedated  AEDs during EEG study: LEV, PHT, propofol  Technical aspects: This EEG study was done with scalp electrodes positioned according to the 10-20 International system of electrode placement. Electrical activity was acquired at a sampling rate of 500Hz  and reviewed with a high frequency filter of 70Hz  and a low frequency filter of 1Hz . EEG data were recorded continuously and digitally stored.   Description: EEG showed continuous generalized and lateralized right hemisphere 3 to 6 Hz theta-delta slowing. Sharp waves were noted in right frontotemporal region. Hyperventilation and photic stimulation were not performed.     ABNORMALITY - Sharp wave, right frontotemporal region -Continuous slow, generalized and lateralized right hemisphere  IMPRESSION: This study showed evidence of epileptogenicity and cortical dysfunction in right hemisphere likely secondary to underlying structural abnormality/bleed. There is also severe diffuse encephalopathy, nonspecific etiology but likely related to sedation. No seizures were noted during this study.  Stephen Chung 

## 2020-11-15 NOTE — Progress Notes (Signed)
NAME:  Stephen Chung, MRN:  [id number], DOB:  October 11, 1960, LOS: 2 ADMISSION DATE:  12/05/2020, CONSULTATION DATE:  12/10/2020 REFERRING MD:  Dr. Selina Cooley, CHIEF COMPLAINT:  Ventilator management    History of Present Illness:  Stephen Chung is a 60 y/o male with unknown PMH who presented to the emergency department after being found with left-sided weakness and aphasia. On EMS arrival patient was able to move right UE spontaneously but unable to follow commands. Last known normal time was unknown.   On arrival vital signs significant for hypothermia with all vital signs stable. Labwork significant for glucose 214, creatine 1.27, anion gap 20, AST 81, wbc 13.7, hgb 9.4. Stroke head CT and CTA head and neck with acute intraparenchymal hemorrhage. During imaging patient vomited and began having seizure like activity therefore patient was intubated for airway protection and PCCM was consulted for vent management. NSG was consulted and did not feel as if evacuation was needed as ICH was likely not a survivable.   Pertinent  Medical History  Unknown   Significant Hospital Events: Including procedures, antibiotic start and stop dates in addition to other pertinent events   . 11/25/2020 admitted after being found . 11/14/2020 repeat head CT was stable. No worsening of ICH. Marland Kitchen 11/15/2020 ceftriaxone started for possible pneumonia  Interim History / Subjective:  Patient lying in bed, sedated. Febrile overnight. Has been getting Tylenol for low-grade fevers.  Propofol 30  Objective   Blood pressure (!) 142/65, pulse (!) 109, temperature (!) 101.4 F (38.6 C), temperature source Axillary, resp. rate (!) 28, height 5\' 4"  (1.626 m), weight 71.7 kg, SpO2 100 %.    Vent Mode: PRVC FiO2 (%):  [30 %] 30 % Set Rate:  [16 bmp] 16 bmp Vt Set:  [350 mL] 350 mL PEEP:  [5 cmH20] 5 cmH20 Pressure Support:  [5 cmH20] 5 cmH20 Plateau Pressure:  [14 cmH20] 14 cmH20   Intake/Output Summary (Last 24 hours)  at 11/15/2020 0838 Last data filed at 11/15/2020 0800 Gross per 24 hour  Intake 2767.59 ml  Output 2760 ml  Net 7.59 ml   Filed Weights   11/14/2020 1256 12/09/2020 1600  Weight: 73 kg 71.7 kg    Examination: General: Chronically ill appearing. Lying in bed. On vent. No acute distress. HEENT: Pupils equal and responsive to light. Neuro: Does not follow commands or arouse to voice. Does not withdraw to pain. Occasional right arm and right foot jerking. CV: Regular rate and rhythm. Systolic murmur present along sternal border. PULM: On vent. Lungs CTAB. No increased work of breathing. No wheezes or crackles. GI: Abd soft, nontender. Extremities: Warm and dry. 2+ pulses. Skin: No visible lesions or rashes.  Labs/imaging that I havepersonally reviewed    Head CTA 12/11/2020 > No evidence of large or medium vessel occlusion, significant atherosclerotic vascular disease, aneurysm or vascular malformation. Large right intraparenchymal hemorrhage with intravascular penetration as shown by immediate previous head CT.  Head CT 11/14/20 > Stable. No major changes from prior scan.  Resolved Hospital Problem list     Assessment & Plan:  Large acute ICH basal ganglia with midline shift  -Not a surgical candidate. CT 5/2 did not show worsening of bleed. Stable but unlikely to improve and likely not a survivable event. C/b seizure activity. P: - Management per neurology: continuous EEG; BP mgmt with Cleviprex (goal SBP < 140); Seizure control with keppra, versed, vimpat, phenytoin and propofol; goal Na 150-160 - Maintain neuro protective measures; goal for eurothermia, euglycemia,  eunatermia, normoxia, and PCO2 goal of 35-40 - Nutrition and bowel regiment  - Aspirations precautions  - Neuro team planning on GOC discussion today (5/3) - No acute need or indication for central line placement at this time   Basilar infiltrate in right lung, possible pneumonia - Patient had low-grade fevers overnight and  has a borderline WBC (11.9). It is difficult to know if fever and elevated WBC are from inflammatory response post-ICH, or if they are due to true infection. New infiltrate on CXR does raise concern for pneumonia. Patient has been on the ventilator for ~45 hours, which makes VAP a less likely diagnosis. Aspiration pneumonia is a possibility, especially given significant neurologic deficits. Will also evaluate for UTI, although clinical suspicion is low. P:  - Blood cultures pending - Resp cultures pending - U/A pending - Empiric abx - ceftriaxone (started 11/15/20)  Acute Hypoxic Respiratory Failure  -In setting of ICH, now c/b possible pneumonia P: - Pt on PRVC overnight.  - Continue ventilator support with lung protective strategies  - Wean PEEP and FiO2 for sats greater than 90%. - Head of bed elevated 30 degrees. - Plateau pressures less than 30 cm H20.  - Follow intermittent chest x-ray and ABG.   - Ensure adequate pulmonary hygiene  - VAP bundle in place  - PAD protocol  Rest of care managed per primary   Best practice   Diet:  NPO Pain/Anxiety/Delirium protocol (if indicated): Yes (RASS goal -1) VAP protocol (if indicated): Yes DVT prophylaxis: Contraindicated GI prophylaxis: PPI Glucose control:  SSI Yes Central venous access:  N/A Arterial line:  N/A Foley:  Yes, and it is still needed Mobility:  bed rest  PT consulted: Yes Last date of multidisciplinary goals of care discussion Pending  Code Status:  full code Disposition: ICU  Sallee Provencal, MS4

## 2020-11-15 NOTE — Progress Notes (Signed)
RT NOTE: RT transported patient on ventilator from room 4N24 to room 4N22 with no complications. Vitals are stable. RT will continue to monitor.

## 2020-11-16 ENCOUNTER — Inpatient Hospital Stay (HOSPITAL_COMMUNITY): Payer: Self-pay

## 2020-11-16 DIAGNOSIS — Z515 Encounter for palliative care: Secondary | ICD-10-CM

## 2020-11-16 DIAGNOSIS — Z7189 Other specified counseling: Secondary | ICD-10-CM

## 2020-11-16 DIAGNOSIS — Z66 Do not resuscitate: Secondary | ICD-10-CM

## 2020-11-16 DIAGNOSIS — I629 Nontraumatic intracranial hemorrhage, unspecified: Secondary | ICD-10-CM

## 2020-11-16 LAB — COMPREHENSIVE METABOLIC PANEL
ALT: 33 U/L (ref 0–44)
AST: 73 U/L — ABNORMAL HIGH (ref 15–41)
Albumin: 2.8 g/dL — ABNORMAL LOW (ref 3.5–5.0)
Alkaline Phosphatase: 60 U/L (ref 38–126)
Anion gap: 10 (ref 5–15)
BUN: 19 mg/dL (ref 6–20)
CO2: 23 mmol/L (ref 22–32)
Calcium: 8.4 mg/dL — ABNORMAL LOW (ref 8.9–10.3)
Chloride: 124 mmol/L — ABNORMAL HIGH (ref 98–111)
Creatinine, Ser: 1.03 mg/dL (ref 0.61–1.24)
GFR, Estimated: >60 mL/min (ref >60)
Glucose, Bld: 164 mg/dL — ABNORMAL HIGH (ref 70–99)
Potassium: 2.6 mmol/L — CL (ref 3.5–5.1)
Sodium: 157 mmol/L — ABNORMAL HIGH (ref 135–145)
Total Bilirubin: 0.7 mg/dL (ref 0.3–1.2)
Total Protein: 7.8 g/dL (ref 6.5–8.1)

## 2020-11-16 LAB — PHENYTOIN LEVEL, TOTAL: Phenytoin Lvl: 17 ug/mL (ref 10.0–20.0)

## 2020-11-16 LAB — CBC WITH DIFFERENTIAL/PLATELET
Abs Immature Granulocytes: 0.07 10*3/uL (ref 0.00–0.07)
Basophils Absolute: 0.1 10*3/uL (ref 0.0–0.1)
Basophils Relative: 1 %
Eosinophils Absolute: 0.1 10*3/uL (ref 0.0–0.5)
Eosinophils Relative: 1 %
HCT: 28.9 % — ABNORMAL LOW (ref 39.0–52.0)
Hemoglobin: 8.2 g/dL — ABNORMAL LOW (ref 13.0–17.0)
Immature Granulocytes: 1 %
Lymphocytes Relative: 10 %
Lymphs Abs: 0.8 10*3/uL (ref 0.7–4.0)
MCH: 21.2 pg — ABNORMAL LOW (ref 26.0–34.0)
MCHC: 28.4 g/dL — ABNORMAL LOW (ref 30.0–36.0)
MCV: 74.9 fL — ABNORMAL LOW (ref 80.0–100.0)
Monocytes Absolute: 0.3 10*3/uL (ref 0.1–1.0)
Monocytes Relative: 4 %
Neutro Abs: 7.3 10*3/uL (ref 1.7–7.7)
Neutrophils Relative %: 83 %
Platelets: 97 10*3/uL — ABNORMAL LOW (ref 150–400)
RBC: 3.86 MIL/uL — ABNORMAL LOW (ref 4.22–5.81)
RDW: 22.7 % — ABNORMAL HIGH (ref 11.5–15.5)
WBC: 8.7 10*3/uL (ref 4.0–10.5)
nRBC: 0.5 % — ABNORMAL HIGH (ref 0.0–0.2)

## 2020-11-16 LAB — MAGNESIUM: Magnesium: 1.7 mg/dL (ref 1.7–2.4)

## 2020-11-16 LAB — SODIUM: Sodium: 158 mmol/L — ABNORMAL HIGH (ref 135–145)

## 2020-11-16 MED ORDER — HALOPERIDOL LACTATE 5 MG/ML IJ SOLN
2.0000 mg | INTRAMUSCULAR | Status: DC | PRN
  Administered 2020-11-17: 2 mg via INTRAVENOUS
  Filled 2020-11-16: qty 1

## 2020-11-16 MED ORDER — BIOTENE DRY MOUTH MT LIQD: 15.0000 mL | OROMUCOSAL | Status: DC | PRN

## 2020-11-16 MED ORDER — ACETAMINOPHEN 650 MG RE SUPP: 650.0000 mg | Freq: Four times a day (QID) | RECTAL | Status: DC | PRN

## 2020-11-16 MED ORDER — ACETAMINOPHEN 325 MG PO TABS
650.0000 mg | ORAL_TABLET | Freq: Four times a day (QID) | ORAL | Status: DC | PRN
  Administered 2020-11-16: 650 mg via ORAL
  Filled 2020-11-16: qty 2

## 2020-11-16 MED ORDER — GLYCOPYRROLATE 0.2 MG/ML IJ SOLN
0.2000 mg | INTRAMUSCULAR | Status: DC | PRN
  Filled 2020-11-16: qty 1

## 2020-11-16 MED ORDER — GLYCOPYRROLATE 0.2 MG/ML IJ SOLN
0.2000 mg | INTRAMUSCULAR | Status: DC | PRN
  Administered 2020-11-16 – 2020-11-17 (×4): 0.2 mg via INTRAVENOUS
  Filled 2020-11-16 (×3): qty 1

## 2020-11-16 MED ORDER — BIOTENE DRY MOUTH MT LIQD
15.0000 mL | OROMUCOSAL | Status: DC
  Administered 2020-11-16 – 2020-11-17 (×5): 15 mL via TOPICAL

## 2020-11-16 MED ORDER — GLYCOPYRROLATE 1 MG PO TABS
1.0000 mg | ORAL_TABLET | ORAL | Status: DC | PRN
Start: 2020-11-16 — End: 2020-11-18
  Filled 2020-11-16: qty 1

## 2020-11-16 MED ORDER — POTASSIUM CHLORIDE 20 MEQ PO PACK: 40.0000 meq | PACK | ORAL | Status: DC

## 2020-11-16 MED ORDER — POLYVINYL ALCOHOL 1.4 % OP SOLN
1.0000 [drp] | Freq: Four times a day (QID) | OPHTHALMIC | Status: DC | PRN
  Filled 2020-11-16: qty 15

## 2020-11-16 MED ORDER — PANTOPRAZOLE SODIUM 40 MG PO PACK: 40.0000 mg | PACK | ORAL | Status: DC

## 2020-11-16 MED ORDER — SODIUM CHLORIDE 0.9 % IV SOLN
150.0000 mg | Freq: Three times a day (TID) | INTRAVENOUS | Status: DC
  Filled 2020-11-16: qty 3

## 2020-11-16 MED ORDER — LORAZEPAM 2 MG/ML IJ SOLN
1.0000 mg | INTRAMUSCULAR | Status: DC
  Administered 2020-11-16 – 2020-11-18 (×5): 1 mg via INTRAVENOUS
  Filled 2020-11-16 (×5): qty 1

## 2020-11-16 MED ORDER — HALOPERIDOL LACTATE 2 MG/ML PO CONC
2.0000 mg | ORAL | Status: DC | PRN
  Filled 2020-11-16: qty 1

## 2020-11-16 MED ORDER — HALOPERIDOL 0.5 MG PO TABS
0.5000 mg | ORAL_TABLET | ORAL | Status: DC | PRN
  Filled 2020-11-16: qty 1

## 2020-11-16 MED ORDER — MAGNESIUM SULFATE 2 GM/50ML IV SOLN
2.0000 g | Freq: Once | INTRAVENOUS | Status: AC
  Administered 2020-11-16: 2 g via INTRAVENOUS
  Filled 2020-11-16: qty 50

## 2020-11-16 MED ORDER — ONDANSETRON 4 MG PO TBDP: 4.0000 mg | ORAL_TABLET | Freq: Four times a day (QID) | ORAL | Status: DC | PRN

## 2020-11-16 MED ORDER — MORPHINE SULFATE (PF) 2 MG/ML IV SOLN
2.0000 mg | INTRAVENOUS | Status: DC | PRN
Start: 2020-11-16 — End: 2020-11-18
  Administered 2020-11-17 (×2): 2 mg via INTRAVENOUS
  Filled 2020-11-16 (×2): qty 1

## 2020-11-16 MED ORDER — POTASSIUM CHLORIDE 20 MEQ PO PACK
40.0000 meq | PACK | Freq: Once | ORAL | Status: AC
  Administered 2020-11-16: 40 meq

## 2020-11-16 MED ORDER — ONDANSETRON HCL 4 MG/2ML IJ SOLN: 4.0000 mg | Freq: Four times a day (QID) | INTRAMUSCULAR | Status: DC | PRN

## 2020-11-16 MED ORDER — HALOPERIDOL LACTATE 2 MG/ML PO CONC
0.5000 mg | ORAL | Status: DC | PRN
  Filled 2020-11-16: qty 0.3

## 2020-11-16 MED ORDER — MORPHINE SULFATE (PF) 2 MG/ML IV SOLN
1.0000 mg | INTRAVENOUS | Status: DC | PRN
  Administered 2020-11-16: 1 mg via INTRAVENOUS
  Filled 2020-11-16: qty 1

## 2020-11-16 MED ORDER — HALOPERIDOL LACTATE 5 MG/ML IJ SOLN: 0.5000 mg | INTRAMUSCULAR | Status: DC | PRN

## 2020-11-16 MED ORDER — HALOPERIDOL 0.5 MG PO TABS
2.0000 mg | ORAL_TABLET | ORAL | Status: DC | PRN
  Filled 2020-11-16: qty 2

## 2020-11-16 MED ORDER — POTASSIUM CHLORIDE 20 MEQ PO PACK
40.0000 meq | PACK | ORAL | Status: DC
  Filled 2020-11-16: qty 2

## 2020-11-16 NOTE — Progress Notes (Signed)
Assisted tele visit to patient with family member.  Yunuen Mordan Anderson, RN   

## 2020-11-16 NOTE — Progress Notes (Signed)
NAME:  Stephen Chung, MRN:  [id number], DOB:  16-Dec-1960, LOS: 3 ADMISSION DATE:  Nov 23, 2020, CONSULTATION DATE:  11-23-2020 REFERRING MD:  Dr. Selina Cooley, CHIEF COMPLAINT:  Ventilator management    History of Present Illness:  Stephen Chung is a 60 y/o male with unknown PMH who presented to the emergency department after being found with left-sided weakness and aphasia. On EMS arrival patient was able to move right UE spontaneously but unable to follow commands. Last known normal time was unknown.   On arrival vital signs significant for hypothermia with all vital signs stable. Labwork significant for glucose 214, creatine 1.27, anion gap 20, AST 81, wbc 13.7, hgb 9.4. Stroke head CT and CTA head and neck with acute intraparenchymal hemorrhage. During imaging patient vomited and began having seizure like activity therefore patient was intubated for airway protection and PCCM was consulted for vent management. NSG was consulted and did not feel as if evacuation was needed as ICH was likely not a survivable.   Pertinent  Medical History  Unknown   Significant Hospital Events: Including procedures, antibiotic start and stop dates in addition to other pertinent events   . 11/23/2020 admitted after being found . 11/14/2020 repeat head CT was stable. No worsening of ICH. Marland Kitchen 11/15/2020 ceftriaxone started for possible pneumonia  Interim History / Subjective:  Patient lying in bed, sedated. Tmax 99.7. No acute events overnight.  Propofol 30  Objective   Blood pressure 134/66, pulse 96, temperature 98.7 F (37.1 C), temperature source Axillary, resp. rate (!) 24, height 5\' 4"  (1.626 m), weight 71.7 kg, SpO2 100 %.    Vent Mode: PRVC FiO2 (%):  [30 %] 30 % Set Rate:  [16 bmp] 16 bmp Vt Set:  [350 mL] 350 mL PEEP:  [5 cmH20] 5 cmH20 Plateau Pressure:  [13 cmH20-17 cmH20] 13 cmH20   Intake/Output Summary (Last 24 hours) at 11/16/2020 1101 Last data filed at 11/16/2020 0700 Gross per 24 hour   Intake 2426.39 ml  Output 1950 ml  Net 476.39 ml   Filed Weights   2020-11-23 1256 2020-11-23 1600  Weight: 73 kg 71.7 kg    Examination: General: Chronically ill appearing. Lying in bed. In no acute distress. On vent. HEENT: Pupils equal and reactive to light. Gaze deviation to the right. Neuro: Stuporous. Does not follow commands or arouse to voice. Localizes pain. Gag and cough reflex intact. Extension of left arm in response to noxious stimuli. Flexion of RUE, RLE and LLE in response to noxious stimuli.  CV: Regular rate and rhythm. Systolic murmur present along sternal border. PULM: On vent. Coarse breath sounds bilaterally. No increased work of breathing. No wheezes or crackles. GI: Abd soft, nontender. Extremities: Warm and dry. 2+ pulses. Skin: No visible lesions or rashes.  Labs/imaging that I havepersonally reviewed    Head CTA 2020/11/23 > No evidence of large or medium vessel occlusion, significant atherosclerotic vascular disease, aneurysm or vascular malformation. Large right intraparenchymal hemorrhage with intravascular penetration as shown by immediate previous head CT.  Head CT 11/14/20 > Stable. No major changes from prior scan.  WBC 11.9 > 8.7  K 2.6, repleting  Resolved Hospital Problem list     Assessment & Plan:  Large acute ICH basal ganglia with midline shift  -Not a surgical candidate. CT 5/2 did not show worsening of bleed. Stable but unlikely to improve and likely not a survivable event. C/b seizure activity. ICH 3, 72% mortality. P: - Management per neurology: continuous EEG; BP mgmt with  Cleviprex (goal SBP < 140); Seizure control with keppra, versed, phenytoin and propofol. Hypertonic saline for cerebral edema, goal Na 150-160. - Maintain neuro protective measures; goal for eurothermia, euglycemia, eunatermia, normoxia, and PCO2 goal of 35-40 - Nutrition and bowel regiment  - Aspirations precautions  - Pt made DNR yesterday by family. Will continue GOC  discussion with family. Neuro to talk with family again around 11am per note  - No acute need or indication for central line placement at this time   Basilar infiltrate in right lung, possible pneumonia - Patient had low-grade fevers 5/2-5/3 overnight and had a borderline WBC (11.9). It is difficult to know if fever and elevated WBC are from inflammatory response post-ICH, or if they are due to true infection. New infiltrate seen on CXR 5/3 was concerning for pneumonia. Patient has been on the ventilator for ~45 hours, which makes VAP a less likely diagnosis. Aspiration pneumonia is a possibility, especially given significant neurologic deficits. Patient started on ceftriaxone 5/3. Repeat CXR 5/4 shows same RLL infiltration. WBC 5/4 improved (11.9 --> 8.7). P:  - Blood cultures no growth at 24 hours. - Resp cultures showed few streptococcus pneumoniae - U/A wnl - Continue ceftriaxone (started 11/15/20)  Acute Hypoxic Respiratory Failure  -In setting of ICH, now c/b possible pneumonia P: - Pt on PRVC - Continue ventilator support with lung protective strategies  - Wean PEEP and FiO2 for sats greater than 90%. - Head of bed elevated 30 degrees. - Plateau pressures less than 30 cm H20.  - Follow intermittent chest x-ray and ABG.   - Ensure adequate pulmonary hygiene  - VAP bundle in place  - PAD protocol  Rest of care managed per primary   Best practice   Diet:  NPO Pain/Anxiety/Delirium protocol (if indicated): Yes (RASS goal -1) VAP protocol (if indicated): Yes DVT prophylaxis: Contraindicated GI prophylaxis: PPI Glucose control:  SSI Yes Central venous access:  N/A Arterial line:  N/A Foley:  Yes, and it is still needed Mobility:  bed rest  PT consulted: Yes Last date of multidisciplinary goals of care discussion Pending  Code Status:  full code Disposition: ICU  Sallee Provencal, MS4

## 2020-11-16 NOTE — Progress Notes (Signed)
Pt terminally extubated to room air, per order.

## 2020-11-16 NOTE — Progress Notes (Signed)
LTM maintenance completed; no skin breakdown was seen. Tested event button with [facility]. 

## 2020-11-16 NOTE — Consult Note (Signed)
Consultation Note Date: 11/16/2020   Patient Name: Stephen Chung  DOB: 18-Feb-1961  MRN: [id number]  Age / Sex: 60 y.o., male  PCP: No primary care provider on file. Referring Physician: Marvel Plan, MD  Reason for Consultation: Terminal Care  HPI/Patient Profile: 60 y.o. male  with past medical history of ETOH abuse, back pain, pancytopenia, marked hepatic steatosis, and ETOH related hyponatremia admitted on 12-04-2020 with dense aphasia, right gaze and left neglect. Diagnosed with large right basal ganglionic/thamalic hemorrhage with intraventricular extension and associated uncal herniation with cerebral edema and 1mm midline shift. With status epilepticus in ED. Also with possible pneumonia. Neurology had discussion with family 5/4 and decision was made for extubation as family reported he would not want to live that way and would want to be left in peace. PMT consulted for assistance.   Clinical Assessment and Goals of Care: At the time I visited patient he had already been extubated following conversation between family and neuro. Tremor or R hand noticed. Breathing appeared unlabored. No s/s of pain. Discussed plan with nurse - increased dose and frequency of PRN morphine. Added scheduled ativan. All questions answered. Nurse has my contact info for further questions. Will plan to reevaluate tomorrow.    Primary Decision Maker NEXT OF KIN - son and daughter    SUMMARY OF RECOMMENDATIONS   Continue full comfort measures - medications adjusted as above Anticipate hospital death  Code Status/Advance Care Planning:  DNR  Prognosis:   Hours - Days  Discharge Planning: Anticipated Hospital Death      Primary Diagnoses: Present on Admission: . ICH (intracerebral hemorrhage) (HCC)   I have reviewed the medical record, interviewed the patient and family, and examined the patient. The following aspects are pertinent.  No past  medical history on file. Social History   Socioeconomic History  . Marital status: Not on file    Spouse name: Not on file  . Number of children: Not on file  . Years of education: Not on file  . Highest education level: Not on file  Occupational History  . Not on file  Tobacco Use  . Smoking status: Not on file  . Smokeless tobacco: Not on file  Substance and Sexual Activity  . Alcohol use: Not on file  . Drug use: Not on file  . Sexual activity: Not on file  Other Topics Concern  . Not on file  Social History Narrative  . Not on file   Social Determinants of Health   Financial Resource Strain: Not on file  Food Insecurity: Not on file  Transportation Needs: Not on file  Physical Activity: Not on file  Stress: Not on file  Social Connections: Not on file   No family history on file. Scheduled Meds: . antiseptic oral rinse  15 mL Topical Q4H  . LORazepam  1 mg Intravenous Q4H   Continuous Infusions: PRN Meds:.acetaminophen **OR** acetaminophen, glycopyrrolate **OR** glycopyrrolate **OR** glycopyrrolate, haloperidol **OR** haloperidol **OR** haloperidol lactate, morphine injection, ondansetron **OR** ondansetron (ZOFRAN) IV, polyvinyl alcohol Not on File Review of Systems  Unable to perform ROS: Mental status change    Physical Exam Constitutional:      General: He is not in acute distress.    Comments: unresponsive  Pulmonary:     Effort: Pulmonary effort is normal.  Skin:    General: Skin is warm and dry.     Vital Signs: BP (!) 146/70   Pulse 99   Temp (!) 102.8 F (39.3 C) (Axillary)  Comment: RN notified, interventions in place Comment (Src): cooling blanket turned ON  Resp (!) 25   Ht 5\' 4"  (1.626 m)   Wt 71.7 kg   SpO2 100%   BMI 27.13 kg/m  Pain Scale: CPOT       SpO2: SpO2: 100 % O2 Device:SpO2: 100 % O2 Flow Rate: .   IO: Intake/output summary:   Intake/Output Summary (Last 24 hours) at 11/16/2020 1628 Last data filed at 11/16/2020  1200 Gross per 24 hour  Intake 1661.43 ml  Output 1200 ml  Net 461.43 ml    LBM: Last BM Date:  (PTA) Baseline Weight: Weight: 73 kg Most recent weight: Weight: 71.7 kg     Palliative Assessment/Data: PPS 10%    Time Total: 30 minutes Greater than 50%  of this time was spent counseling and coordinating care related to the above assessment and plan.  01/16/2021, DNP, AGNP-C Palliative Medicine Team [phone] Pager: [phone]

## 2020-11-16 NOTE — Procedures (Addendum)
Patient Name:Stephen Chung DEY:[id number] Epilepsy Attending:Roxsana Riding Annabelle Harman Referring Physician/Provider:Dr Bing Neighbors Duration:11/15/2020 2127 to 5/4/20221636  Patient history:unknown age male with L sided weakness and aphasia. EEG to evaluate for seizure  Level of alertness:comatose/sedated  AEDs during EEG study:LEV,PHT,propofol  Technical aspects: This EEG study was done with scalp electrodes positioned according to the 10-20 International system of electrode placement. Electrical activity was acquired at a sampling rate of 500Hz  and reviewed with a high frequency filter of 70Hz  and a low frequency filter of 1Hz . EEG data were recorded continuously and digitally stored.   Description:EEG showed continuous generalized and lateralized right hemisphere 3 to 6 Hz theta-deltaslowing. Sharp waves were noted in right frontotemporal region. Hyperventilation and photic stimulation were not performed.   Event button was pressed on 11/16/2020 at 0407 during which patient was noted to be moving his left and right leg intermittently.  Concomitant EEG before, during and after the event did not show any EEG changes suggest seizure.  ABNORMALITY - Sharp wave, right frontotemporal region -Continuous slow, generalized and lateralized right hemisphere  IMPRESSION: This studyshowed evidence ofepileptogenicity andcortical dysfunction in right hemisphere likely secondary to underlying structural abnormality/bleed. There is also moderate to severe diffuse encephalopathy, nonspecific etiology but likely related to sedation. No seizures were noted during this study.  Event button was pressed on 11/16/2020 at 0407 during which patient was noted to be moving his left and right leg intermittently without concomitant EEG change. This was NOT an epileptic event.  Starlett Pehrson 

## 2020-11-16 NOTE — Progress Notes (Signed)
RT transported pt to and from CT without event. 

## 2020-11-16 NOTE — Progress Notes (Addendum)
STROKETEAMPROGRESSNOTE   INTERVALHISTORY  Patient remains intubated and vented on sedation with propofol. He remains with only withdrawal response to noxious stimuli on the right. No visible seizure activity overnight per nursing.  RLL pneumonia is suspected. CCM considering starting antibiotic therapy. Remains on HTS with Na 158 at goal 150-160. Still requiring cleviprex drip for BP management.  Nurse and spanish interpreter at bedside.  I had a long interpretor assisted family discussion via Elink with son Demetra Shiner, and daughter-in-law.  I updated the family on the patient's current neurological exam in detail.  I explained that the patient has a large BG  ICH with interventricular extension and cerebral edema.  He is on HTS for the cerebral edema.  His course has been complicated by seizures requiring keppra and dilantin.  He is on continuous EEG monitoring.  He also has suspected pneumonia on antibiotics with current fever.  He has AKD with creatinine of 1.03.  I explained that Neurosurgery has been consulted and no intervention is appropriate at this time.  He may not survive this devastating insult to his brain.  I explained the need for possible trach/peg.  He is currently on full vent support. The family stated that "he would not want to live in this condition and suffer".  "He would want to leave in peace".  The family members present in the meeting decided to proceed with comfort care measures only.  The patient was made DNR and was terminally extubated.         Vitals:   11/16/20 1131 11/16/20 1132 11/16/20 1200 11/16/20 1300  BP: 132/67  134/65 (!) 146/70  Pulse: 100  97 99  Resp: (!) 27  (!) 27 (!) 25  Temp:   (!) 102.8 F (39.3 C)   TempSrc:   Axillary   SpO2: 100% 100% 100% 100%  Weight:      Height:       CBC: Recent Labs  Lab 11/14/20 1237 11/15/20 0555 11/16/20 0749  WBC 12.1* 11.9* 8.7  NEUTROABS 11.1*  --  7.3  HGB 7.4* 7.9* 8.2*  HCT 26.0* 27.9*  28.9*  MCV 72.8* 72.8* 74.9*  PLT 128* 134* 97*   BasicMetabolicPanel: Recent Labs  Lab 11/15/20 0555 11/15/20 1214 11/15/20 1837 11/16/20 0009 11/16/20 0749  NA 153* 154*   < > 158* 157*  K 2.8*  --   --   --  2.6*  CL 119*  --   --   --  124*  CO2 25  --   --   --  23  GLUCOSE 143*  --   --   --  164*  BUN 7  --   --   --  19  CREATININE 0.72  --   --   --  1.03  CALCIUM 8.8*  --   --   --  8.4*  MG  --  1.3*  --   --  1.7   < > = values in this interval not displayed.   LipidPanel: Recent Labs  Lab 11/15/20 0041  CHOL 223*  TRIG 85  HDL 116  CHOLHDL 1.9  VLDL 17  LDLCALC 90    UrineDrugScreen: Recent Labs  Lab 11/16/2020 1721  LABOPIA NONE DETECTED  COCAINSCRNUR NONE DETECTED  LABBENZ POSITIVE*  AMPHETMU NONE DETECTED  THCU NONE DETECTED  LABBARB NONE DETECTED   AlcoholLevel Recent Labs  Lab 2020-11-16 1148  ETH <10    IMAGING/PERTINENT DIAGNOSTIC STUDIES CT Code Stroke  1.  Acute intraparenchymal hemorrhage in the right basal ganglia, dissecting into the right temporal lobe, with intraventricular communication. Mild surrounding edema. Approximate volume 82 cc. Mass effect with right-to-left shift of 9 mm and uncal herniation.  CTA No evidence of large or medium vessel occlusion, significant atherosclerotic vascular disease, aneurysm or vascular malformation.Large right intraparenchymal hemorrhage with intravascular penetration as shown by immediate previous head CT.  Repeat CT head 5/1 No significant interval change in size and morphology of large intraparenchymal hemorrhage centered at the right basal ganglia with intraventricular extension. Surrounding vasogenic edema withregional mass effect and up to 9 mm of right-to-left shift,relatively stable. Slightly increased asymmetric dilatation of the left lateral ventricle as compared to previous exam. No transependymal flow of CSF. No other new acute intracranial abnormality  Physical Exam Gen:  Critically ill intubated middle aged male resting with eyes closed, calm and comfortable apperance CV: RRR. tachy Resp: Intubated, resp even and unlabored Abd: round, soft NTTP Ext: no  edema,  Neuro: MS: Remains sedated on propofol which was paused for exam. Does not respond to voice or clap. PERRL 71mm, weak corneals present bilaterally, +gag, + doll's eyes, minimal withdrawal x 4 extremities to noxious stimuli.   Assessment 60 yo male who presented with identity and medical history unknown. History obtained from old [facility] chart indicates history of ETOH abuse, back pain, pancytopenia, marked hepatic steatosis,  ETOH related hyponatremia, who was brought in by EMS as Code stroke for dense aphasia, right gaze and left neglect found to have BG IPH and IVH, early uncal herniation with status epilepticus in CT scanner, s/p intubation for airway protection.   Large right basal ganglionic/thamalic hemorrhage with intraventricular extension,and  associated uncal herniation with cerebral edema and 45mm midline shift. Suspect hypertensive etiology.    CTA: NO LVO  2DEcho: EF 65-70%, mild   Repeat HCT stable  Neurosurgery, Dr. Conchita Paris, consulted with no intervention recommended. Advised this is a devastating hemorrhage and likely fatal.   VTE: prophylaxis-SCDs  No anticoagulants or antiplatelets  AC/AP prior to admission: Home medications/history unknown.   Stroke deficits TBD, likely substantial, poor prognosis  Therapy recommendations: orders discontinued, patient is comfort care  Disposition: comfort care  Cerebral Edema  Neurosurgery: no intervention recommended  Repeat HCT 5/2: showed stable hematoma, midline shift and ventricular size.    HTS @75ccc /hr, discontinue  Na 133->137->146->147->153->  Seizure  Clinical status epilepticus during code stroke in ED  Given ativan and loaded with Keppra then q 12 hour dosing at 1500mg .   5 seizures per hour still seen on  EEG 5/2 am review  Discontinue Keppra and Dilantin  Respiratory Failure  Intubated for airway protection, mechanically ventilated with propofol and versed for sedation   Management per CCM  Terminal extubation today per family wishes  Hypertension  No home medications on file  Discontinue cleviprex gtt   Fever Mild leukocytosis  ?RLL Pneumonia   Monitor fever curve: 102->100.8->102  CXR, KUB, without acute concerns 5/1  CXR repeat 5/3: Right base atelectasis and infiltrate. Findings suggest right base pneumonia.CCM considering antibiotics.   Blood cultures are pending  UA, urine culture pending  Discontinue antibiotics  Acute blood loss anemia  9.0->7.4->7.9  Monitor   Elevated serum Ctn 1.27->0.72->1.03  Feeding/Nutrition/Dsyphagia  NPO  High risk for urinary rentention  Bladder scan q 6 hours, I&O prn, replace foley if failed voiding trial x 3 in  6 hour increment  Goals of care  Updated daughter in law, DeForest, by phone as she  speaks fluent  [place] and she had to be at work soon. Malon Kindle, interpretor, assisted with phone call to son, Marijean Niemann. I updated him on patient's critical status, poor prognosis and complex medical scenario. We discussed treatment alternatives and explored comfort care options. He would like to consider and speak to his sibling in [place].  His questions were answered. He requested that Gracilla assist with our team speaking to his sister in [place]. He will call Gracilla back to coordinate this.    Possible substance abuse  ETOH abuse history per chart review  Ethanol <10 on arrival, AST 81  + Benzo on UDS    OtherStrokeRiskFactors  AdvancedAge>/=65  Little history available but does show reported ETOH abuse 2019  Hospital day#3  Lissy Olivencia-Simmons, ACNP-BC  ATTENDING NOTE: I reviewed above note and agree with the assessment and plan. Pt was seen and examined.   Patient overnight no fever but fever around noontime  102.8, still has tachycardia, on 3% saline.  CT this morning showed stable hematoma and IVH.  Sodium 157, worsening thrombocytopenia but improved leukocytosis.  Had family meeting again with son, daughter, daughter-in-law over telephone with interpreter, had long discussion with them about the serious nature of large ICH, poor prognosis and quality of life.  Family requested for comfort care measures.  Patient was then extubated, and comfort care orders placed.  For detailed assessment and plan, please refer to above as I have made changes wherever appropriate.   Marvel Plan, MD PhD Stroke Neurology 11/16/2020 7:30 PM  This patient is critically ill due to large right ICH, IVH, cerebral edema, respiratory failure and at significant risk of neurological worsening, death form brain herniation, sepsis, seizure. This patient's care requires constant monitoring of vital signs, hemodynamics, respiratory and cardiac monitoring, review of multiple databases, neurological assessment, discussion with family, other specialists and medical decision making of high complexity. I spent 40 minutes of neurocritical care time in the care of this patient. I had long discussion with son, daughter and daughter-in-law over the video device in room, updated pt current condition, treatment plan and potential prognosis, and answered all the questions.      To contact Stroke Continuity provider,please refer to [email or web address]. After hours, contact General Neurology

## 2020-11-16 NOTE — Progress Notes (Signed)
Date and time results received: 11/16/20 0909   Test: K+ Critical Value: K+ 2.6  Name of Provider Notified: Dr Roda Shutters, and Romeo Apple from pharmacy on floor and aware  Orders Received? K+ supplemented via tube

## 2020-11-17 DIAGNOSIS — R064 Hyperventilation: Secondary | ICD-10-CM

## 2020-11-17 MED ORDER — MORPHINE 100MG IN NS 100ML (1MG/ML) PREMIX INFUSION
4.0000 mg/h | INTRAVENOUS | Status: DC
  Administered 2020-11-17: 2 mg/h via INTRAVENOUS
  Filled 2020-11-17: qty 100

## 2020-11-17 MED ORDER — MORPHINE BOLUS VIA INFUSION
2.0000 mg | INTRAVENOUS | Status: DC | PRN
Start: 2020-11-17 — End: 2020-11-18
  Filled 2020-11-17: qty 2

## 2020-11-17 MED ORDER — MORPHINE SULFATE (PF) 4 MG/ML IV SOLN: 8.0000 mg | Freq: Once | INTRAVENOUS | Status: DC

## 2020-11-17 NOTE — Progress Notes (Signed)
Daily Progress Note   Patient Name: Stephen Chung       Date: 11/17/2020 DOB: 06-29-1961  Age: 60 y.o. MRN#: [id number] Attending Physician: Marvel Plan, MD Primary Care Physician: No primary care provider on file. Admit Date: 12/09/2020  Reason for Consultation/Follow-up: Terminal Care  Subjective: No response to deep sternal rub Occasional non-purposeful movement of RUE - not to command  Length of Stay: 4  Current Medications: Scheduled Meds:  . antiseptic oral rinse  15 mL Topical Q4H  . LORazepam  1 mg Intravenous Q4H    Continuous Infusions: . morphine      PRN Meds: acetaminophen **OR** acetaminophen, glycopyrrolate **OR** glycopyrrolate **OR** glycopyrrolate, haloperidol **OR** haloperidol **OR** haloperidol lactate, morphine injection, morphine, ondansetron **OR** ondansetron (ZOFRAN) IV, polyvinyl alcohol  Physical Exam Constitutional:      Comments: unrseponsive  Pulmonary:     Comments: Tachypneic, slightly labored Skin:    Comments: Hot to touch             Vital Signs: BP (!) 199/107 (BP Location: Right Arm)   Pulse 98   Temp (!) 101.1 F (38.4 C) (Axillary)   Resp (!) 25   Ht 5\' 4"  (1.626 m)   Wt 71.7 kg   SpO2 99%   BMI 27.13 kg/m  SpO2: SpO2: 99 % O2 Device: O2 Device: Room Air O2 Flow Rate:    Intake/output summary:   Intake/Output Summary (Last 24 hours) at 11/17/2020 1532 Last data filed at 11/17/2020 01/17/2021 Gross per 24 hour  Intake 74.66 ml  Output 3175 ml  Net -3100.34 ml   LBM: Last BM Date:  (PTA) Baseline Weight: Weight: 73 kg Most recent weight: Weight: 71.7 kg       Palliative Assessment/Data: PPS 10%    Flowsheet Rows   Flowsheet Row Most Recent Value  Intake Tab   Referral Department Neurology  Unit at Time of Referral ICU   Palliative Care Primary Diagnosis Neurology  Date Notified 11/16/20  Palliative Care Type New Palliative care  Reason for referral End of Life Care Assistance  Date of Admission 18-Nov-2020  Date first seen by Palliative Care 11/16/20  # of days Palliative referral response time 0 Day(s)  # of days IP prior to Palliative referral 3  Clinical Assessment   Psychosocial & Spiritual Assessment   Palliative Care Outcomes       Patient Active Problem List   Diagnosis Date Noted  . Acute hypercapnic respiratory failure (HCC)   . Encounter for imaging study to confirm orogastric (OG) tube placement   . History of ETT   . ICH (intracerebral hemorrhage) (HCC) 18-Nov-2020    Palliative Care Assessment & Plan   HPI: 60 y.o. male  with past medical history of ETOH abuse, back pain, pancytopenia, marked hepatic steatosis, andETOH related hyponatremia admitted on 11/20/2020 with dense aphasia, right gaze and left neglect. Diagnosed with large right basal ganglionic/thamalic hemorrhage with intraventricular extension andassociated uncal herniation with cerebral edema and 60mm midline shift. With status epilepticus in ED. Also with possible pneumonia. Neurology had discussion with family 5/4 and decision was made for extubation as family reported he would not want to live that way and would want to be  left in peace. PMT consulted for assistance.   Assessment: Long conversation with patient's son and daughter over video chat with assistance of medical interpreter. Family concerned because local family/friends visited and reported to family that patient was moving his arm and "fighting for his life". Family concerned their decisions to transition to comfort measures only was not appropriate.  With interpreter, we reviewed patient's medical condition. We reviewed his devastating hemorrhagic stroke - we discuss that it is large and associated with herniation and midline shift. We reviewed that patient is  unresponsive - he does not open eyes, communicate, or make any attempt to follow commands. We review that the occasional movement in his RUE is not significant and does not change big picture - discussed that patient is not able to wake up and interact, not able to consume nutrition or hydration. We discussed many different scenarios and family expresses acceptance of situation. They understand situation is not survivable and agree to focus on comfort measures.  During conversation patient noticed to have somewhat labored breathing - will initiate morphine infusion.  With family, planned to follow up tomorrow. RN and interpreter will provide update this afternoon.   Recommendations/Plan:  Long conversation with family - educated on situation, they express understanding  Full comfort measures - initiate morphine infusion  Goals of Care and Additional Recommendations:  Limitations on Scope of Treatment: Full Comfort Care  Code Status:  DNR  Prognosis:   Hours - Days  Discharge Planning:  Anticipated Hospital Death  Care plan was discussed with son and daughter with medical interpreter, RN  Thank you for allowing the Palliative Medicine Team to assist in the care of this patient.   Total Time 75 minutes Prolonged Time Billed  yes       Greater than 50%  of this time was spent counseling and coordinating care related to the above assessment and plan.  Gerlean Ren, DNP, [facility] Palliative Medicine Team Team Phone # [phone]  Pager [phone]

## 2020-11-17 NOTE — Plan of Care (Signed)
Pt transferred out of ICU to 3W31 around 2130. Received report from [place], Charity fundraiser. Orders reviewed. Comfort care measures in place. RASS-3. Pt responsive to pain. No respiratory distress noted. Scheduled 1mg  Ativan IV administered Q4HR. PRN haldol IV given once for intermittent agitation. Pt currently resting comfortably, symptoms resolved. No family at bedside. Will continue to monitor and follow plan of care.  Problem: Education: Goal: Knowledge of disease or condition will improve Outcome: Progressing Goal: Knowledge of secondary prevention will improve Outcome: Progressing Goal: Knowledge of patient specific risk factors addressed and post discharge goals established will improve Outcome: Progressing Goal: Individualized Educational Video(s) Outcome: Progressing   Problem: Coping: Goal: Will verbalize positive feelings about self Outcome: Progressing Goal: Will identify appropriate support needs Outcome: Progressing   Problem: Health Behavior/Discharge Planning: Goal: Ability to manage health-related needs will improve Outcome: Progressing   Problem: Self-Care: Goal: Ability to participate in self-care as condition permits will improve Outcome: Progressing Goal: Verbalization of feelings and concerns over difficulty with self-care will improve Outcome: Progressing Goal: Ability to communicate needs accurately will improve Outcome: Progressing   Problem: Nutrition: Goal: Risk of aspiration will decrease Outcome: Progressing Goal: Dietary intake will improve Outcome: Progressing   Problem: Intracerebral Hemorrhage Tissue Perfusion: Goal: Complications of Intracerebral Hemorrhage will be minimized Outcome: Progressing   Problem: Education: Goal: Knowledge of disease or condition will improve Outcome: Progressing Goal: Knowledge of secondary prevention will improve Outcome: Progressing Goal: Knowledge of patient specific risk factors addressed and post discharge goals  established will improve Outcome: Progressing Goal: Individualized Educational Video(s) Outcome: Progressing   Problem: Coping: Goal: Will verbalize positive feelings about self Outcome: Progressing Goal: Will identify appropriate support needs Outcome: Progressing   Problem: Health Behavior/Discharge Planning: Goal: Ability to manage health-related needs will improve Outcome: Progressing   Problem: Self-Care: Goal: Ability to participate in self-care as condition permits will improve Outcome: Progressing Goal: Verbalization of feelings and concerns over difficulty with self-care will improve Outcome: Progressing Goal: Ability to communicate needs accurately will improve Outcome: Progressing   Problem: Nutrition: Goal: Risk of aspiration will decrease Outcome: Progressing Goal: Dietary intake will improve Outcome: Progressing   Problem: Intracerebral Hemorrhage Tissue Perfusion: Goal: Complications of Intracerebral Hemorrhage will be minimized Outcome: Progressing   Problem: Activity: Goal: Ability to tolerate increased activity will improve Outcome: Progressing   Problem: Respiratory: Goal: Ability to maintain a clear airway and adequate ventilation will improve Outcome: Progressing   Problem: Role Relationship: Goal: Method of communication will improve Outcome: Progressing

## 2020-11-17 NOTE — Progress Notes (Signed)
STROKETEAMPROGRESSNOTE   Stephen Chung and two relatives/friends are at the bedside. After priest left, I went into check pt found him to be in tachypnea with labored breathing, ordered morphine bolus and increased morphine infusion rate. Had ipad interpretor with pt son over the phone and updated him the current condition that pt is actively dying at this time. He expressed understanding.        Vitals:   11/16/20 1900 11/16/20 2000 11/16/20 2140 11/17/20 0736  BP:   (!) 169/111 (!) 199/107  Pulse: 87 87 88 98  Resp: 20 (!) 23 19 (!) 25  Temp:   97.9 F (36.6 C) (!) 101.1 F (38.4 C)  TempSrc:   Oral Axillary  SpO2: 97% 95% 98% 99%  Weight:      Height:        Physical Exam Limited exam due to comfort care measures. Pt lying in bed, eyes closed, labored breathing and tachypnea. No spontaneous movement. No significant facial asymmetry.  Assessment 60 yo male who presented with identity and medical history unknown. History obtained from old [employer] chart indicates history of ETOH abuse, back pain, pancytopenia, marked hepatic steatosis,  ETOH related hyponatremia, who was brought in by EMS as Code stroke for dense aphasia, right gaze and left neglect found to have BG IPH and IVH, early uncal herniation with status epilepticus in CT scanner, s/p intubation for airway protection.   Large right basal ganglionic/thamalic hemorrhage with intraventricular extension,and  associated uncal herniation with cerebral edema and 82mm midline shift. Suspect hypertensive etiology.   CTA: NO LVO  2DEcho: EF 65-70%  Repeat HCT stable  Neurosurgery, Dr. Conchita Paris, consulted with no intervention recommended. Advised this is a devastating hemorrhage and likely fatal.   VTE: prophylaxis-SCDs  AC/AP prior to admission: Home medications/history unknown.   No anticoagulants or antiplatelets  Poor prognosis  Therapy recommendations: orders discontinued, patient is comfort  care  Disposition: comfort care   Cerebral Edema  Neurosurgery: no intervention recommended  Repeat HCT 5/2: showed stable hematoma, midline shift and ventricular size.    HTS @75ccc /hr, discontinued for comfort care  Seizure  Clinical status epilepticus during code stroke in ED  Given ativan and loaded with Keppra then q 12 hour dosing at 1500mg .   5 seizures per hour still seen on EEG 5/2 am review  Discontinue Keppra and Dilantin, comfort care  Respiratory Failure  Intubated for airway protection, mechanically ventilated with propofol and versed for sedation   Management per CCM  Terminal extubation 5/4 per family wishes  Currently labored breathing, morphine bolus given and increased morphine maintenance rate  Blood pressure  No home medications on file  Discontinue cleviprex gtt for comfort care  Fever Mild leukocytosis  ?RLL Pneumonia   Fever 101.1  CXR, KUB, without acute concerns 5/1  CXR repeat 5/3: Right base atelectasis and infiltrate. Findings suggest right base pneumonia.CCM considering antibiotics.   Blood cultures no growth x3d  UA nml  Discontinued antibiotics  Acute blood loss anemia  9.0->7.4->7.9  Elevated serum Ctn  1.27->0.72->1.03  Feeding/Nutrition/Dsyphagia d/t stroke  NPO  Goals of care  Comfort care   Palliative care on board  Added morphine gtt 5/5 for labored breathing  Anticipate hospital death   Possible substance abuse  ETOH abuse history per chart review, Little history available but does show reported ETOH abuse 2019  Ethanol <10 on arrival, AST 81  + Benzo on UDS    OtherStrokeRiskFactors  AdvancedAge>/=65  Hospital day #4  [facility]  Roda Shutters, MD PhD Stroke Neurology 11/17/2020 3:52 PM    To contact Stroke Continuity provider,please refer to [email or web address]. After hours, contact General Neurology

## 2020-11-18 LAB — CULTURE, RESPIRATORY W GRAM STAIN

## 2020-11-19 LAB — CULTURE, BLOOD (ROUTINE X 2)
Culture: NO GROWTH
Culture: NO GROWTH
Special Requests: ADEQUATE
Special Requests: ADEQUATE

## 2020-12-14 NOTE — Death Summary Note (Signed)
DEATH SUMMARY   Patient Details  Name: Stephen Chung MRN: [id number] DOB: 1961/04/20  Admission/Discharge Information   Admit Date:  2020-11-28  Date of Death: Date of Death: Dec 03, 2020  Time of Death: Time of Death: 2022/12/28  Length of Stay: 5  Referring Physician: System, Provider Not In   Reason(s) for Hospitalization  ICH  Diagnoses  Preliminary cause of death:     ICH (intracerebral hemorrhage) (HCC)   Cerebral edema   Brain herniation      Secondary Diagnoses (including complications and co-morbidities):    Acute hypercapnic respiratory failure (HCC)   Seizure   Fever   Brief Hospital Course (including significant findings, care, treatment, and services provided and events leading to death)  Stephen Chung is a 60 y.o. year old male with history of ETOH abuse, back pain, pancytopenia, marked hepatic steatosis,  ETOH related hyponatremia, who was brought in by EMS as Code stroke for dense aphasia, right gaze and left neglect found to have BG IPH and IVH, early uncal herniation with status epilepticus in CT scanner, s/p intubation for airway protection.   Large right basal ganglionic/thamalic hemorrhage with intraventricular extension,and  associated uncal herniation with cerebral edema and 9mm midline shift. Suspect hypertensive etiology.   CTA: NO LVO  2DEcho: EF 65-70%  Repeat HCT stable  Neurosurgery, Dr. Conchita Paris, consulted with no intervention recommended. Advised this is a devastating hemorrhage and likely fatal.   VTE: prophylaxis-SCDs  AC/AP prior to admission: Home medications/history unknown.   No anticoagulants or antiplatelets  Poor prognosis  Therapy recommendations: orders discontinued, patient is comfort care  Disposition: comfort care   Cerebral Edema  Neurosurgery: no intervention recommended  Repeat HCT 5/2: showed stable hematoma, midline shift and ventricular size.    HTS /hr, discontinued for comfort  care  Seizure  Clinical status epilepticus during code stroke in ED  Given ativan and loaded with Keppra then q 12 hour dosing at .   5 seizures per hour still seen on EEG 5/2 am review  Discontinue Keppra and Dilantin, comfort care  Respiratory Failure  Intubated for airway protection, mechanically ventilated with propofol and versed for sedation   Management per CCM  Terminal extubation 5/4 per family wishes  On comfort care measures  Blood pressure  No home medications on file  Discontinue cleviprex gtt for comfort care  Fever Mild leukocytosis  ?RLL Pneumonia   Fever 101.1  CXR, KUB, without acute concerns 5/1  CXR repeat 5/3: Right base atelectasis and infiltrate. Findings suggest right base pneumonia.CCM considering antibiotics.   Blood cultures no growth x3d  UA nml  Discontinued antibiotics  Acute blood loss anemia  9.0->7.4->7.9  Elevated serum Ctn  1.27->0.72->1.03  Feeding/Nutrition/Dsyphagia d/t stroke  NPO  Goals of care  Comfort care   Palliative care on board  Added morphine gtt 5/5 for labored breathing  Anticipate hospital death   Possible substance abuse  ETOH abuse history per chart review, Little history available but does show reported ETOH abuse 2019  Ethanol <10 on arrival, AST 81  + Benzo on UDS    OtherStrokeRiskFactors  AdvancedAge>/=58   Pertinent Labs and Studies  Significant Diagnostic Studies CT HEAD WO CONTRAST  Result Date: 11/16/2020 CLINICAL DATA:  Follow-up intracranial hemorrhage EXAM: CT HEAD WITHOUT CONTRAST TECHNIQUE: Contiguous axial images were obtained from the base of the skull through the vertex without intravenous contrast. COMPARISON:  11/14/2020 FINDINGS: Brain: Suboptimal image quality. The patient was region is scanned under the wrong name and the data  was recovered from [facility] area of hemorrhage in the right basal ganglia extending into the right temporal lobe  and right frontal lobe appears unchanged. There is intraventricular extension of hemorrhage. No hydrocephalus. There is midline shift 7 mm to the left unchanged. There is mild subarachnoid hemorrhage bilaterally unchanged. No new area of hemorrhage or infarction. Vascular: Negative for hyperdense vessel Skull: Negative Sinuses/Orbits: Mucosal edema paranasal sinuses unchanged. Other: None IMPRESSION: Large hematoma in the right cerebral hemisphere unchanged. There is intraventricular hemorrhage and midline shift unchanged. Mild subarachnoid hemorrhage on the left unchanged. No change since 11/14/2020. Electronically Signed   By: Marlan Palau M.D.   On: 11/16/2020 15:04   CT HEAD WO CONTRAST  Result Date: 11/14/2020 CLINICAL DATA:  Follow-up intracranial hemorrhage EXAM: CT HEAD WITHOUT CONTRAST TECHNIQUE: Contiguous axial images were obtained from the base of the skull through the vertex without intravenous contrast. COMPARISON:  Head CT yesterday. FINDINGS: Brain: Size of the intraparenchymal hemorrhage in the right hemisphere has not increased since the previous scan. Mild amount surrounding edema is slightly increased. Intraventricular penetration as seen previously with a similar amount of blood dependent in the lateral ventricles. Similar mass effect, with right-to-left shift 9 mm. Ventricular size is stable. No developing hydrocephalus. No new insult. Vascular: No new vascular finding. Skull: Negative Sinuses/Orbits: Clear/normal Other: None IMPRESSION: Stable exam since yesterday. No evidence of increase in size of the right-sided intraparenchymal hematoma. The amount of intraventricular blood is similar. Ventricular size is stable. Mass effect is stable with right-to-left shift of 9 mm. Electronically Signed   By: Paulina Fusi M.D.   On: 11/14/2020 17:06   CT HEAD WO CONTRAST  Result Date: Dec 07, 2020 CLINICAL DATA:  Follow-up examination for acute intracranial hemorrhage. EXAM: CT HEAD WITHOUT  CONTRAST TECHNIQUE: Contiguous axial images were obtained from the base of the skull through the vertex without intravenous contrast. COMPARISON:  Prior CT from earlier the same day. FINDINGS: Brain: Large intraparenchymal hemorrhage centered at the right basal ganglia again seen, overall not appreciably changed in size or morphology as compared to prior exam earlier today. Inferior extension into the temporal lobe inferiorly again noted. Trace surrounding subarachnoid blood. Surrounding vasogenic edema with regional mass effect and up to 9 mm of right-to-left shift, relatively stable. Intraventricular extension with blood within both lateral ventricles again seen. Right lateral ventricle remains largely effaced. There is slightly increased asymmetric dilatation of the left lateral ventricle as compared to previous exam. No transependymal flow of CSF. Basilar cisterns remain patent. No other new acute intracranial hemorrhage. No other acute large vessel territory infarct. No extra-axial collection. Vascular: No visible hyperdense vessel. Skull: Scalp soft tissues and calvarium are stable. Sinuses/Orbits: Globes and orbital soft tissues demonstrate no acute finding. Scattered mucosal thickening noted throughout the paranasal sinuses. Mastoid air cells are clear. Endotracheal and enteric tubes in place. Other: None. IMPRESSION: 1. No significant interval change in size and morphology of large intraparenchymal hemorrhage centered at the right basal ganglia with intraventricular extension. Surrounding vasogenic edema with regional mass effect and up to 9 mm of right-to-left shift, relatively stable. 2. Slightly increased asymmetric dilatation of the left lateral ventricle as compared to previous exam. No transependymal flow of CSF. 3. No other new acute intracranial abnormality. Electronically Signed   By: Rise Mu M.D.   On: 12-07-20 23:07   DG Chest Port 1 View  Result Date: 11/16/2020 CLINICAL DATA:   Respiratory failure. EXAM: PORTABLE CHEST 1 VIEW COMPARISON:  11/15/2020. FINDINGS: Endotracheal  tube and NG tube in stable position. Heart size normal. Low lung volumes. Atelectasis/infiltrate right lung base. No pleural effusion or pneumothorax. No acute bony abnormality. IMPRESSION: 1.  Lines and tubes stable position. 2.  Low lung volumes.  Atelectasis/infiltrate right lung base. Electronically Signed   By: Maisie Fus  Register   On: 11/16/2020 05:46   DG CHEST PORT 1 VIEW  Result Date: 11/15/2020 CLINICAL DATA:  Fever. EXAM: PORTABLE CHEST 1 VIEW COMPARISON:  11/14/2020. FINDINGS: Endotracheal tube and NG tube in stable position. Cardiomegaly. Right base atelectasis and infiltrate. Findings suggest right base pneumonia. No pleural effusion or pneumothorax. IMPRESSION: 1.  Lines and tubes in stable position. 2.  Cardiomegaly. 3. Right base atelectasis and infiltrate. Findings suggest right base pneumonia. Electronically Signed   By: Maisie Fus  Register   On: 11/15/2020 05:43   DG CHEST PORT 1 VIEW  Result Date: 11/14/2020 CLINICAL DATA:  ET placement EXAM: PORTABLE CHEST 1 VIEW COMPARISON:  18-Nov-2020 FINDINGS: Endotracheal tube tip terminates in the mid trachea, 3.5 cm from the carina. Transesophageal tube tip terminates in the gastric antrum with side port distal to the GE junction. Telemetry leads overlie the chest. No consolidative opacity or convincing edema. Some streaky opacities in the lung bases and vascular crowding favor atelectatic change. Stable cardiomediastinal contours. No acute osseous or soft tissue abnormality. IMPRESSION: Lines and tubes as above. Low volumes and atelectasis. Electronically Signed   By: Kreg Shropshire M.D.   On: 11/14/2020 04:40   DG Chest Port 1 View  Result Date: 11/17/2020 CLINICAL DATA:  Intubated.  Code stroke. EXAM: PORTABLE CHEST 1 VIEW COMPARISON:  None. FINDINGS: An ET tube is been placed, in good position terminating in the mid trachea. The NG tube terminates below  today's film. No pneumothorax. The cardiomediastinal silhouette is unremarkable. No pulmonary nodules, masses, or focal infiltrates. IMPRESSION: 1. Support apparatus as above. 2. No other abnormalities. Electronically Signed   By: Gerome Sam III M.D   On: 12/01/2020 12:54   DG Abd Portable 1V  Result Date: Nov 18, 2020 CLINICAL DATA:  Enteric catheter placement EXAM: PORTABLE ABDOMEN - 1 VIEW COMPARISON:  None. FINDINGS: Frontal view of the lower chest and upper abdomen demonstrates enteric catheter coiled over the gastric fundus. There is excreted contrast within the kidneys and ureter from preceding CT exam. Lung bases are clear. IMPRESSION: 1. Enteric catheter overlying gastric fundus. Electronically Signed   By: Sharlet Salina M.D.   On: 12/01/2020 17:15   Overnight EEG with video  Result Date: 11/14/2020 Charlsie Quest, MD     11/15/2020  9:48 AM Patient Name: Shaune Pollack MRN: [id number] Epilepsy Attending: Charlsie Quest Referring Physician/Provider: Dr Bing Neighbors Duration: 12/02/2020 2127 to 11/14/2020 2127 Patient history: unknown age male with L sided weakness and aphasia. EEG to evaluate for seizure Level of alertness: comatose/sedated AEDs during EEG study: LEV, PHT, propofol, versed Technical aspects: This EEG study was done with scalp electrodes positioned according to the 10-20 International system of electrode placement. Electrical activity was acquired at a sampling rate of 500Hz  and reviewed with a high frequency filter of 70Hz  and a low frequency filter of 1Hz . EEG data were recorded continuously and digitally stored. Description: EEG showed continuous generalized and lateralized right hemisphere 3 to 6 Hz theta-delta slowing with overriding 15 to 18 Hz beta activity in frontocentral region. Seizures without clinical signs also noted arising from right frontotemporal region, 5-7 seizures an hour, lasting about 1 minute each, last seizure at 1043.  Lateralized  periodic discharges were  noted in right frontotemporal region at 0.5 Hz. Hyperventilation and photic stimulation were not performed.   ABNORMALITY -Seizure without clinical signs, right frontotemporal region -Lateralized periodic discharges, right frontotemporal region -Continuous slow, generalized and lateralized right hemisphere IMPRESSION: This study showed seizures without clinical signs arising from right frontotemporal region, average 5-7 seizures per hour, lasting about 1 minute each.  Last seizure was noted at 1043 on 11/14/2020.  Additionally there is evidence of epileptogenicity and cortical dysfunction in right hemisphere likely secondary to underlying structural abnormality/bleed. There is also severe diffuse encephalopathy, nonspecific etiology but likely related to sedation. Charlsie Questriyanka O Yadav   ECHOCARDIOGRAM COMPLETE  Result Date: 11/14/2020    ECHOCARDIOGRAM REPORT   Patient Name:   Volney AmericanRENATO Chung Date of Exam: 11/14/2020 Medical Rec #:  [id number]              Height:       64.0 in Accession #:    [id number]             Weight:       158.1 lb Date of Birth:  07/02/1961              BSA:          1.770 m Patient Age:    59 years               BP:           132/71 mmHg Patient Gender: M                      HR:           108 bpm. Exam Location:  Inpatient Procedure: 2D Echo, Color Doppler and Cardiac Doppler Indications:     Stroke  History:         Patient has no prior history of Echocardiogram examinations.                  Risk Factors:Hypertension. ETOH. ICH.  Sonographer:     Sheralyn Boatmanina West RDCS Referring Phys:  [id number] DELILA A BAILEY-MODZIK Diagnosing Phys: Orpah CobbAjay Kadakia MD  Sonographer Comments: Technically difficult study due to poor echo windows and echo performed with patient supine and on artificial respirator. Image acquisition challenging due to respiratory motion. IMPRESSIONS  1. Left ventricular ejection fraction, by estimation, is 65 to 70%. The left ventricle has normal function. The left ventricle has no  regional wall motion abnormalities. There is mild concentric left ventricular hypertrophy. Left ventricular diastolic parameters are indeterminate.  2. Right ventricular systolic function is normal. The right ventricular size is normal.  3. Left atrial size was moderately dilated.  4. Right atrial size was mildly dilated.  5. The mitral valve is normal in structure. Mild mitral valve regurgitation. No evidence of mitral stenosis.  6. The aortic valve is tricuspid. There is mild calcification of the aortic valve. Aortic valve regurgitation is not visualized. Mild aortic valve sclerosis is present, with no evidence of aortic valve stenosis.  7. Aortic dilatation noted. There is borderline dilatation of the ascending aorta, measuring 42 mm.  8. The inferior vena cava is dilated in size with <50% respiratory variability, suggesting right atrial pressure of 15 mmHg. FINDINGS  Left Ventricle: Left ventricular ejection fraction, by estimation, is 65 to 70%. The left ventricle has normal function. The left ventricle has no regional wall motion abnormalities. The left ventricular internal cavity size was normal in size. There is  mild concentric  left ventricular hypertrophy. Left ventricular diastolic parameters are indeterminate. Right Ventricle: The right ventricular size is normal. No increase in right ventricular wall thickness. Right ventricular systolic function is normal. Left Atrium: Left atrial size was moderately dilated. Right Atrium: Right atrial size was mildly dilated. Pericardium: There is no evidence of pericardial effusion. Mitral Valve: The mitral valve is normal in structure. Mild mitral annular calcification. Mild mitral valve regurgitation. No evidence of mitral valve stenosis. Tricuspid Valve: The tricuspid valve is normal in structure. Tricuspid valve regurgitation is mild. Aortic Valve: The aortic valve is tricuspid. There is mild calcification of the aortic valve. Aortic valve regurgitation is not  visualized. Mild aortic valve sclerosis is present, with no evidence of aortic valve stenosis. Pulmonic Valve: The pulmonic valve was normal in structure. Pulmonic valve regurgitation is not visualized. Aorta: The aortic root is normal in size and structure and aortic dilatation noted. There is borderline dilatation of the ascending aorta, measuring 42 mm. There is minimal (Grade I) atheroma plaque involving the ascending aorta. Venous: The inferior vena cava is dilated in size with less than 50% respiratory variability, suggesting right atrial pressure of 15 mmHg. IAS/Shunts: No atrial level shunt detected by color flow Doppler.  LEFT VENTRICLE PLAX 2D LVIDd:         3.70 cm     Diastology LVIDs:         2.00 cm     LV e' medial:    5.98 cm/s LV PW:         1.70 cm     LV E/e' medial:  23.2 LV IVS:        1.40 cm     LV e' lateral:   13.40 cm/s LVOT diam:     1.90 cm     LV E/e' lateral: 10.4 LV SV:         88 LV SV Index:   50 LVOT Area:     2.84 cm  LV Volumes (MOD) LV vol d, MOD A2C: 62.5 ml LV vol d, MOD A4C: 69.7 ml LV vol s, MOD A2C: 14.6 ml LV vol s, MOD A4C: 19.6 ml LV SV MOD A2C:     47.9 ml LV SV MOD A4C:     69.7 ml LV SV MOD BP:      53.8 ml RIGHT VENTRICLE             IVC RV S prime:     12.95 cm/s  IVC diam: 2.70 cm TAPSE (M-mode): 2.0 cm LEFT ATRIUM             Index       RIGHT ATRIUM           Index LA diam:        4.80 cm 2.71 cm/m  RA Area:     14.20 cm LA Vol (A2C):   50.1 ml 28.30 ml/m RA Volume:   30.00 ml  16.95 ml/m LA Vol (A4C):   63.2 ml 35.70 ml/m LA Biplane Vol: 59.5 ml 33.61 ml/m  AORTIC VALVE LVOT Vmax:   173.00 cm/s LVOT Vmean:  118.000 cm/s LVOT VTI:    0.310 m  AORTA Ao Root diam: 3.40 cm Ao Asc diam:  4.05 cm MITRAL VALVE MV Area (PHT): 6.32 cm     SHUNTS MV Decel Time: 120 msec     Systemic VTI:  0.31 m MV E velocity: 139.00 cm/s  Systemic Diam: 1.90 cm Orpah Cobb MD Electronically signed by Viviano Simas  Algie Coffer MD Signature Date/Time: 11/14/2020/6:18:26 PM    Final    CT HEAD  CODE STROKE WO CONTRAST  Result Date: 2020-11-14 CLINICAL DATA:  Code stroke.  Acute stroke presentation EXAM: CT HEAD WITHOUT CONTRAST TECHNIQUE: Contiguous axial images were obtained from the base of the skull through the vertex without intravenous contrast. COMPARISON:  None. FINDINGS: Brain: Acute intraparenchymal hemorrhage with the epicenter in the right basal ganglia measuring approximately 7.7 x 6.0 x 3.4 cm (volume = 82 cm^3). Hematoma dissects into the substance of the right temporal lobe communicates with the temporal horn of the right lateral ventricle. Mild surrounding edema. Intraventricular penetration with blood dependent in both lateral ventricles. Mass-effect with right-to-left midline shift of 9 mm. Elsewhere, brain appears intrinsically unremarkable. No evidence of old stroke. No extra-axial collection. Vascular: There is atherosclerotic calcification of the major vessels at the base of the brain. Skull: Negative Sinuses/Orbits: Clear/normal Other: None IMPRESSION: 1. Acute intraparenchymal hemorrhage in the right basal ganglia, dissecting into the right temporal lobe, with intraventricular communication. Mild surrounding edema. Approximate volume 82 cc. Mass effect with right-to-left shift of 9 mm and uncal herniation. 2. These results were communicated to Dr. Selina Cooley At 12:00 pmon 2022-05-02by text page via the [facility] messaging system. Electronically Signed   By: Paulina Fusi M.D.   On: 11/14/2020 12:02   CT ANGIO HEAD CODE STROKE  Result Date: 11/14/2020 CLINICAL DATA:  Follow-up large right intraparenchymal hemorrhage EXAM: CT ANGIOGRAPHY HEAD AND NECK TECHNIQUE: Multidetector CT imaging of the head and neck was performed using the standard protocol during bolus administration of intravenous contrast. Multiplanar CT image reconstructions and MIPs were obtained to evaluate the vascular anatomy. Carotid stenosis measurements (when applicable) are obtained utilizing NASCET criteria, using the  distal internal carotid diameter as the denominator. CONTRAST:  60mL OMNIPAQUE IOHEXOL 350 MG/ML SOLN COMPARISON:  Head CT earlier same day FINDINGS: CTA NECK FINDINGS Aortic arch: Minimal aortic atherosclerosis. Branching pattern is normal without origin stenosis. Right carotid system: Common carotid artery widely patent to the bifurcation. Carotid bifurcation is normal without soft or calcified plaque. Cervical ICA is normal. Left carotid system: Common carotid artery widely patent to the bifurcation. Carotid bifurcation is normal. Cervical ICA is normal. Vertebral arteries: Both vertebral arteries are widely patent at their origins and through the cervical region to the foramen magnum. Detail at the non dominant right vertebral artery origin is poor because of adjacent venous contrast. Skeleton: Minimal cervical spondylosis. Other neck: No mass or adenopathy. Endotracheal intubation and orogastric tube. Upper chest: Lung apices are clear. Review of the MIP images confirms the above findings CTA HEAD FINDINGS Anterior circulation: Both internal carotid arteries are patent through the skull base and siphon regions. No stenosis. The anterior and middle cerebral vessels are patent. No large or medium vessel occlusion. No sign of aneurysm or high flow vascular abnormality. Posterior circulation: Both vertebral arteries are patent to the basilar. No basilar stenosis. Posterior circulation branch vessels are patent. Venous sinuses: Patent and normal. Anatomic variants: None significant. Review of the MIP images confirms the above findings IMPRESSION: No evidence of large or medium vessel occlusion, significant atherosclerotic vascular disease, aneurysm or vascular malformation. Large right intraparenchymal hemorrhage with intravascular penetration as shown by immediate previous head CT. Electronically Signed   By: Paulina Fusi M.D.   On: Nov 14, 2020 16:06   CT ANGIO NECK CODE STROKE  Result Date: 11-14-2020 CLINICAL  DATA:  Follow-up large right intraparenchymal hemorrhage EXAM: CT ANGIOGRAPHY HEAD AND NECK TECHNIQUE: Multidetector  CT imaging of the head and neck was performed using the standard protocol during bolus administration of intravenous contrast. Multiplanar CT image reconstructions and MIPs were obtained to evaluate the vascular anatomy. Carotid stenosis measurements (when applicable) are obtained utilizing NASCET criteria, using the distal internal carotid diameter as the denominator. CONTRAST:  60mL OMNIPAQUE IOHEXOL 350 MG/ML SOLN COMPARISON:  Head CT earlier same day FINDINGS: CTA NECK FINDINGS Aortic arch: Minimal aortic atherosclerosis. Branching pattern is normal without origin stenosis. Right carotid system: Common carotid artery widely patent to the bifurcation. Carotid bifurcation is normal without soft or calcified plaque. Cervical ICA is normal. Left carotid system: Common carotid artery widely patent to the bifurcation. Carotid bifurcation is normal. Cervical ICA is normal. Vertebral arteries: Both vertebral arteries are widely patent at their origins and through the cervical region to the foramen magnum. Detail at the non dominant right vertebral artery origin is poor because of adjacent venous contrast. Skeleton: Minimal cervical spondylosis. Other neck: No mass or adenopathy. Endotracheal intubation and orogastric tube. Upper chest: Lung apices are clear. Review of the MIP images confirms the above findings CTA HEAD FINDINGS Anterior circulation: Both internal carotid arteries are patent through the skull base and siphon regions. No stenosis. The anterior and middle cerebral vessels are patent. No large or medium vessel occlusion. No sign of aneurysm or high flow vascular abnormality. Posterior circulation: Both vertebral arteries are patent to the basilar. No basilar stenosis. Posterior circulation branch vessels are patent. Venous sinuses: Patent and normal. Anatomic variants: None significant.  Review of the MIP images confirms the above findings IMPRESSION: No evidence of large or medium vessel occlusion, significant atherosclerotic vascular disease, aneurysm or vascular malformation. Large right intraparenchymal hemorrhage with intravascular penetration as shown by immediate previous head CT. Electronically Signed   By: Paulina Fusi M.D.   On: 12/04/2020 16:06    Microbiology Recent Results (from the past 240 hour(s))  Culture, blood (Routine X 2) w Reflex to ID Panel     Status: None   Collection Time: 11/14/20  6:21 PM   Specimen: BLOOD RIGHT ARM  Result Value Ref Range Status   Specimen Description BLOOD RIGHT ARM  Final   Special Requests   Final    BOTTLES DRAWN AEROBIC ONLY Blood Culture adequate volume   Culture   Final    NO GROWTH 5 DAYS Performed at [facility], [address], [place], [place] [address]    Report Status 11/19/2020 FINAL  Final  Culture, blood (Routine X 2) w Reflex to ID Panel     Status: None   Collection Time: 11/14/20  6:30 PM   Specimen: BLOOD RIGHT ARM  Result Value Ref Range Status   Specimen Description BLOOD RIGHT ARM  Final   Special Requests   Final    BOTTLES DRAWN AEROBIC ONLY Blood Culture adequate volume   Culture   Final    NO GROWTH 5 DAYS Performed at [facility], [address] [place], [place] [address]    Report Status 11/19/2020 FINAL  Final  Culture, Respiratory w Gram Stain     Status: None   Collection Time: 11/15/20 11:40 AM   Specimen: Tracheal Aspirate; Respiratory  Result Value Ref Range Status   Specimen Description TRACHEAL ASPIRATE  Final   Special Requests NONE  Final   Gram Stain   Final    FEW SQUAMOUS EPITHELIAL CELLS PRESENT ABUNDANT WBC PRESENT, PREDOMINANTLY PMN ABUNDANT GRAM NEGATIVE RODS ABUNDANT GRAM POSITIVE COCCI  FEW GRAM POSITIVE RODS Performed at [facility], [address], [place], [place] [address]    Culture FEW STREPTOCOCCUS PNEUMONIAE  Final   Report Status  12/04/2020 FINAL  Final   Organism ID, Bacteria STREPTOCOCCUS PNEUMONIAE  Final      Susceptibility   Streptococcus pneumoniae - MIC*    ERYTHROMYCIN <=0.12 SENSITIVE Sensitive     LEVOFLOXACIN 0.5 SENSITIVE Sensitive     VANCOMYCIN 0.5 SENSITIVE Sensitive     PENO - penicillin <=0.06      PENICILLIN (non-meningitis) <=0.06 SENSITIVE Sensitive     PENICILLIN (oral) <=0.06 SENSITIVE Sensitive     CEFTRIAXONE (non-meningitis) <=0.12 SENSITIVE Sensitive     * FEW STREPTOCOCCUS PNEUMONIAE    Lab Basic Metabolic Panel: No results for input(s): NA, K, CL, CO2, GLUCOSE, BUN, CREATININE, CALCIUM, MG, PHOS in the last 168 hours. Liver Function Tests: No results for input(s): AST, ALT, ALKPHOS, BILITOT, PROT, ALBUMIN in the last 168 hours. No results for input(s): LIPASE, AMYLASE in the last 168 hours. No results for input(s): AMMONIA in the last 168 hours. CBC: No results for input(s): WBC, NEUTROABS, HGB, HCT, MCV, PLT in the last 168 hours. Cardiac Enzymes: No results for input(s): CKTOTAL, CKMB, CKMBINDEX, TROPONINI in the last 168 hours. Sepsis Labs: No results for input(s): PROCALCITON, WBC, LATICACIDVEN in the last 168 hours.  Procedures/Operations  Intubation and extubation   Marvel Plan 11/24/2020, 12:19 PM

## 2020-12-14 NOTE — Progress Notes (Signed)
ON-CALL NEUROLOGY  NOTE  Notified of patient's time of death 12:30 AM. Death certificate in [employer] [facility] Discharge summary to be completed by the a.m. team on rounding.  -- Milon Dikes, MD Neurologist Pager: [phone]

## 2020-12-14 NOTE — Progress Notes (Signed)
TIME OF DEATH 0030. Verified by this nurse and charge nurse, Ricke Hey, RN. Contacted Dr Wilford Corner and notified. Called patient son, [name] and daughter n law [name] [phone]. Family lives out of state and do not know what funeral home they are going to contact at this time. Gave them the information for patient placement and phone number to call when they have made these decisions. Contacted [employer] and spoke with [employer].

## 2020-12-14 DEATH — deceased

## 2021-11-29 IMAGING — CT CT HEAD W/O CM
4 series · 16 of 47 positions shown, 18 images · non-contrast
Comparison: Head CT yesterday.

CLINICAL DATA: Follow-up intracranial hemorrhage

EXAM:
CT HEAD WITHOUT CONTRAST
TECHNIQUE: Contiguous axial images were obtained from the base of the skull
through the vertex without intravenous contrast.

[Series 3: head without · axial · non-contrast · 0.42mm/px · z∈[-122,-2]mm · 7 of 34 slices shown, 9 images]
[im 5/34  brain]
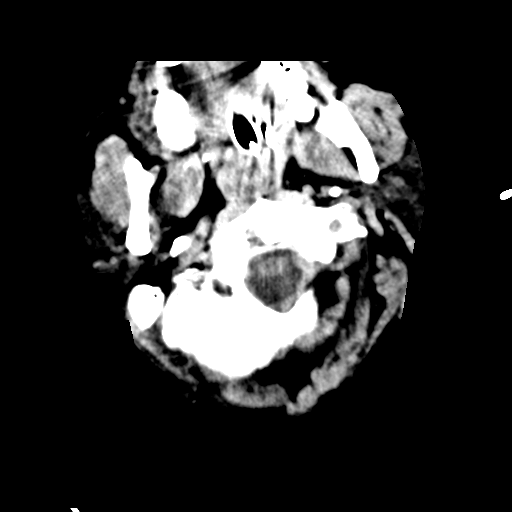
[im 5/34  bone]
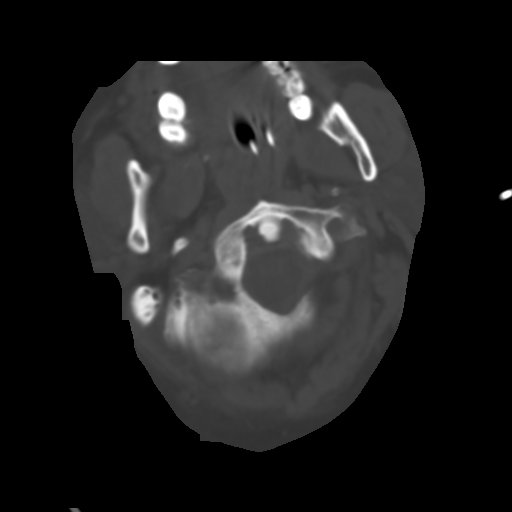
[im 9/34  brain]
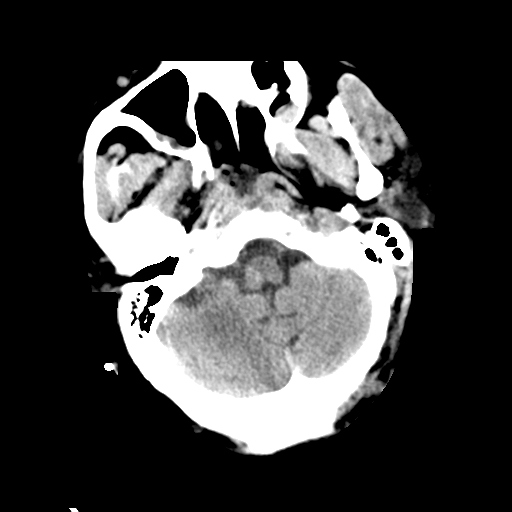
[im 13/34  brain]
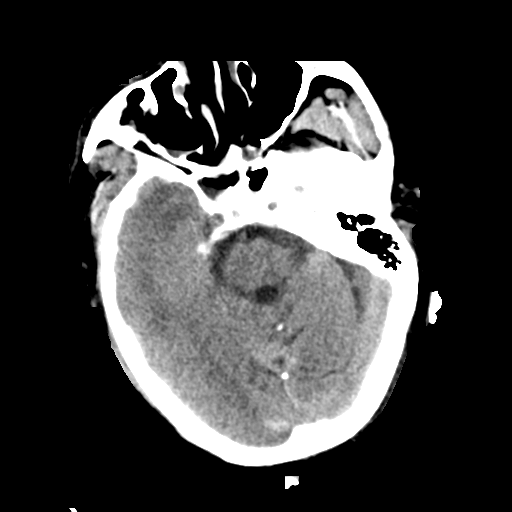
[im 17/34  brain]
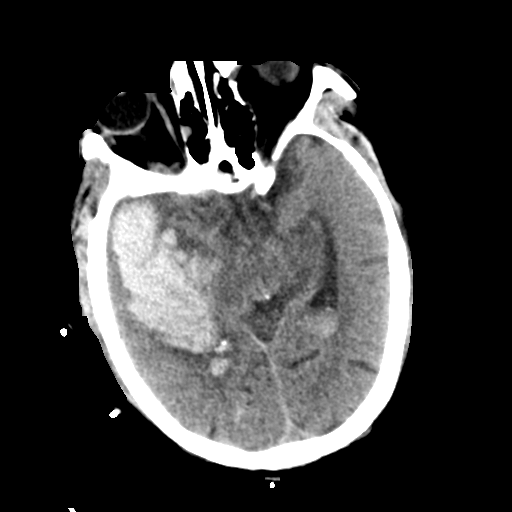
[im 21/34  brain]
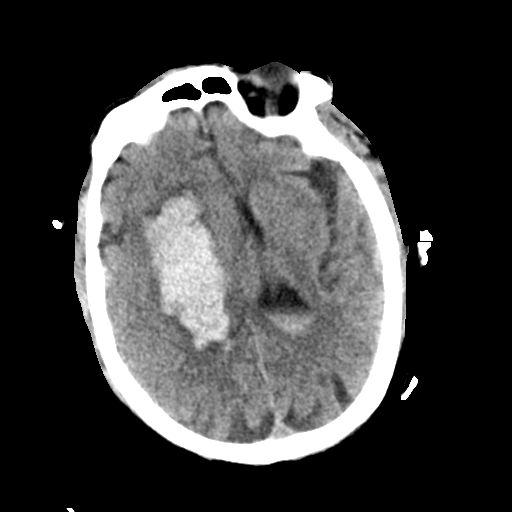
[im 21/34  bone]
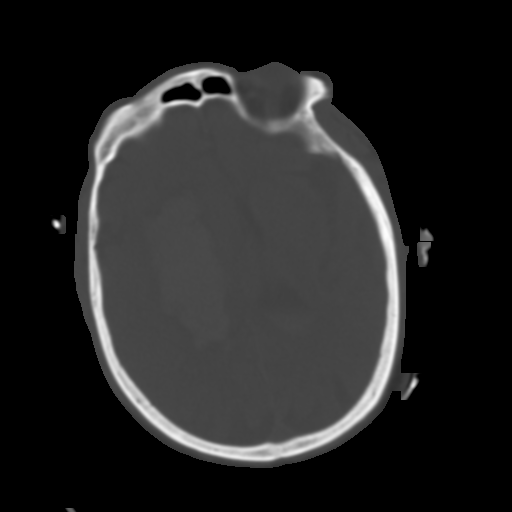
[im 25/34  brain]
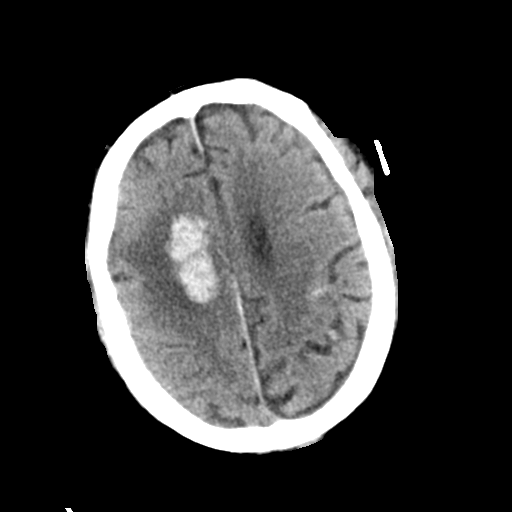
[im 29/34  brain]
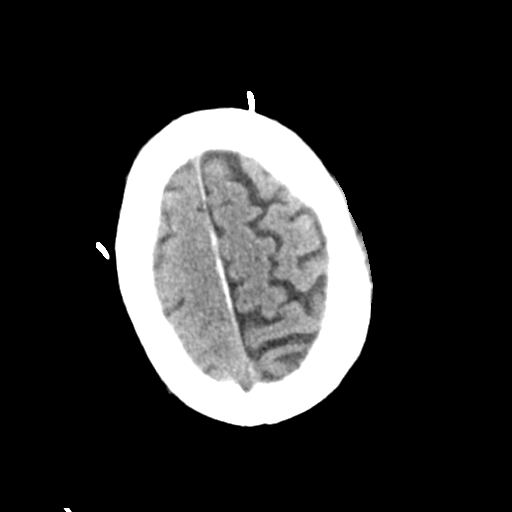

[Series 4: head bone · axial · 0.42mm/px · z∈[-126,-92]mm · 3 of 85 slices shown]
[im 9/85  bone]
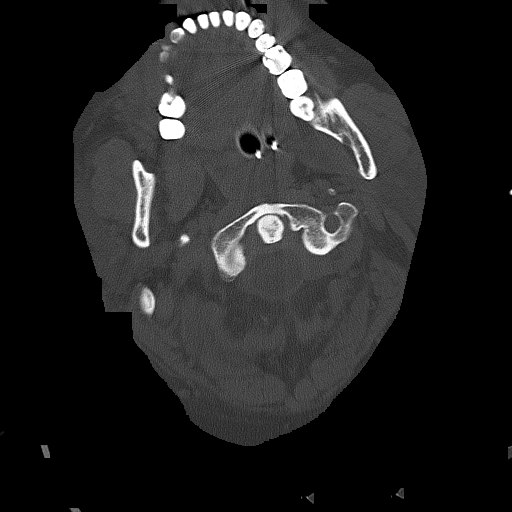
[im 17/85  bone]
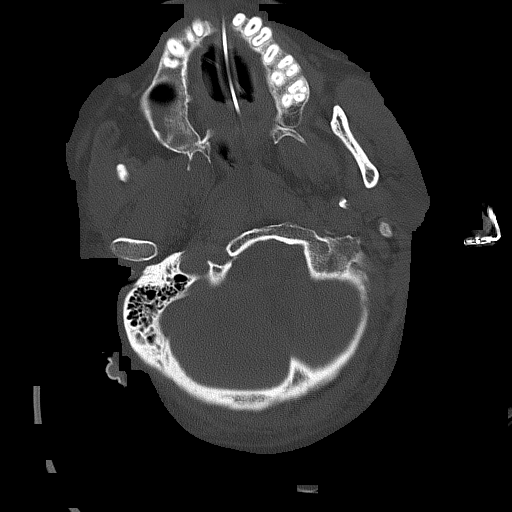
[im 26/85  bone]
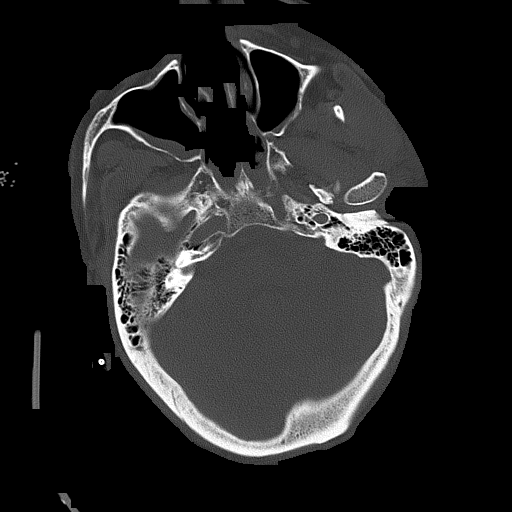

[Series 5: head without cor · coronal · non-contrast · 0.33mm/px · 3 of 67 slices shown]
[im 23/67  brain]
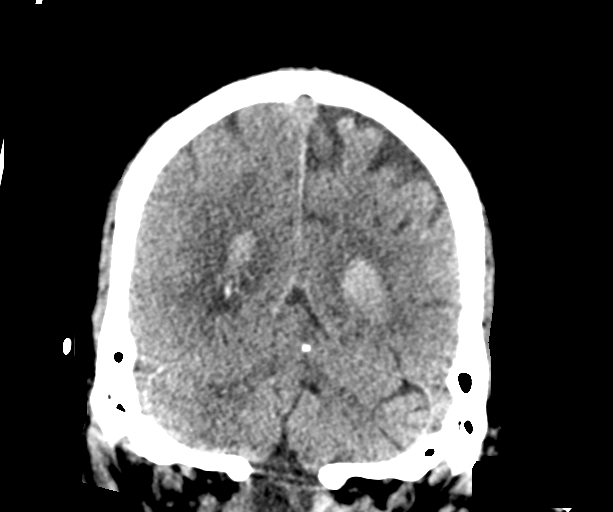
[im 30/67  brain]
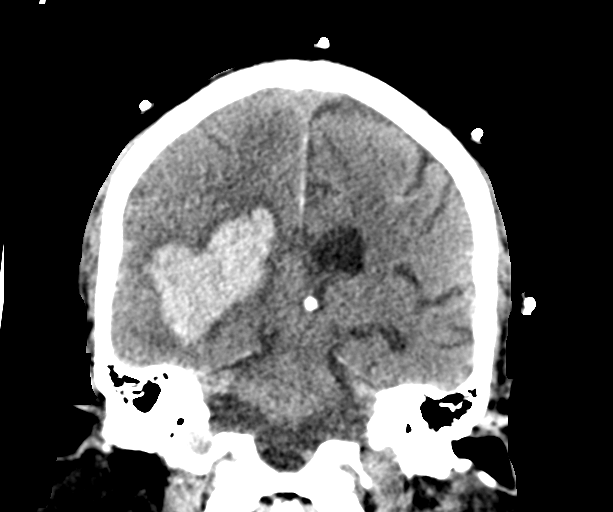
[im 37/67  brain]
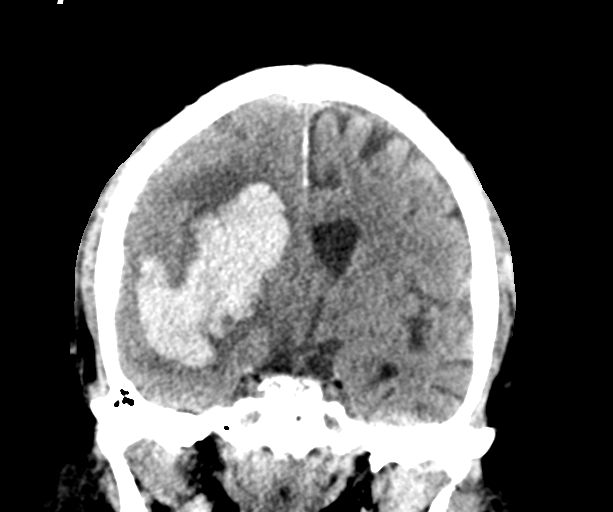

[Series 6: head without sag · sagittal · non-contrast · 0.33mm/px · 3 of 67 slices shown]
[im 29/67  brain]
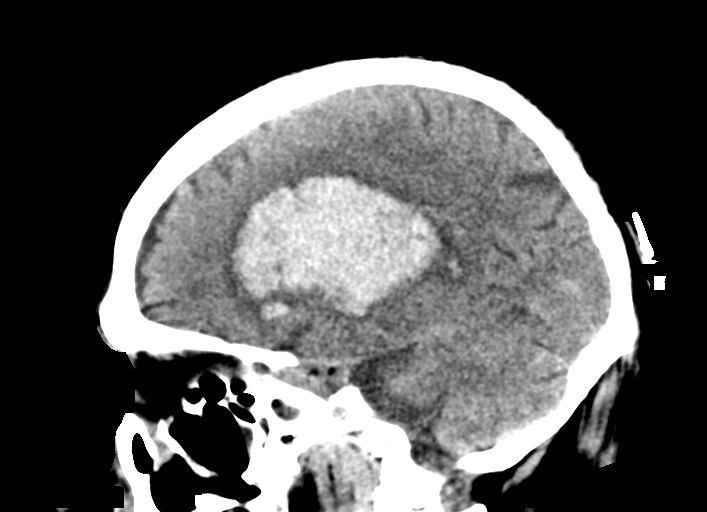
[im 34/67  brain]
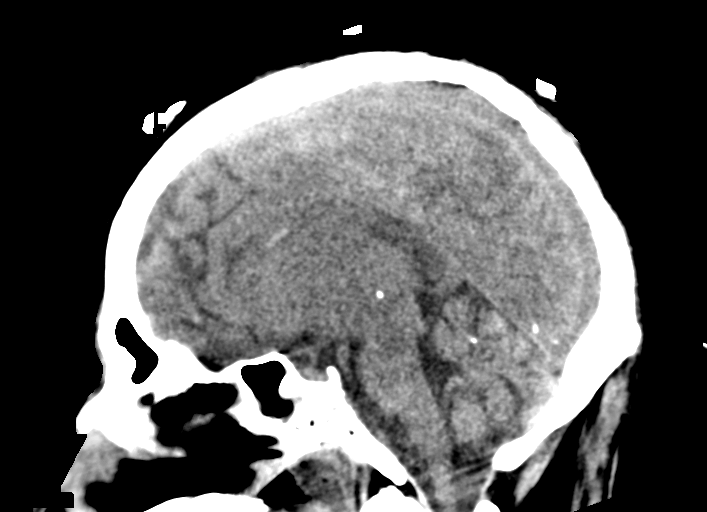
[im 39/67  brain]
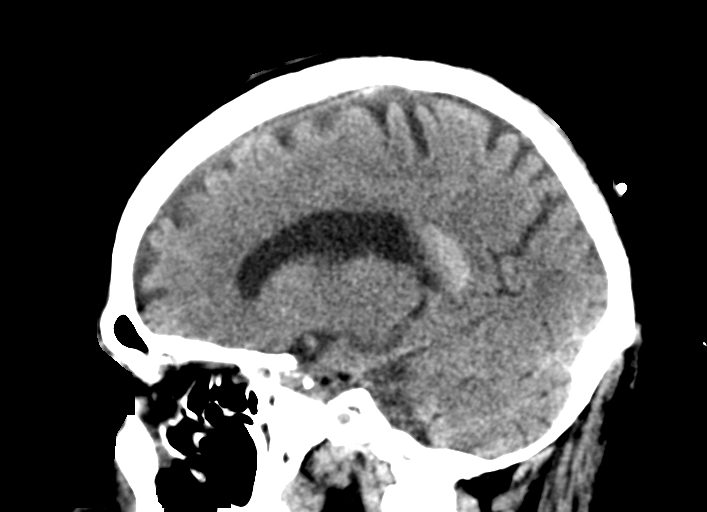

[16 of 47 positions shown; findings below may reference images not displayed]

FINDINGS: Brain: Size of the intraparenchymal hemorrhage in the right
hemisphere has not increased since the previous scan. Mild amount
surrounding edema is slightly increased. Intraventricular
penetration as seen previously with a similar amount of blood
dependent in the lateral ventricles. Similar mass effect, with
right-to-left shift 9 mm. Ventricular size is stable. No developing
hydrocephalus. No new insult.

Vascular: No new vascular finding.

Skull: Negative

Sinuses/Orbits: Clear/normal

Other: None
IMPRESSION: Stable exam since yesterday. No evidence of increase in size of the
right-sided intraparenchymal hematoma. The amount of
intraventricular blood is similar. Ventricular size is stable. Mass
effect is stable with right-to-left shift of 9 mm.
